# Patient Record
Sex: Male | Born: 1970 | Race: White | Hispanic: No | Marital: Married | Smoking: Current every day smoker
Health system: Southern US, Community
[De-identification: ages and names within clinical notes are randomized; demographics above are authoritative.]

## PROBLEM LIST (undated history)

## (undated) DIAGNOSIS — F319 Bipolar disorder, unspecified: Secondary | ICD-10-CM

## (undated) DIAGNOSIS — M5126 Other intervertebral disc displacement, lumbar region: Secondary | ICD-10-CM

## (undated) DIAGNOSIS — F329 Major depressive disorder, single episode, unspecified: Secondary | ICD-10-CM

## (undated) DIAGNOSIS — F419 Anxiety disorder, unspecified: Secondary | ICD-10-CM

## (undated) DIAGNOSIS — F209 Schizophrenia, unspecified: Secondary | ICD-10-CM

## (undated) DIAGNOSIS — F32A Depression, unspecified: Secondary | ICD-10-CM

## (undated) HISTORY — DX: Depression, unspecified: F32.A

## (undated) HISTORY — DX: Major depressive disorder, single episode, unspecified: F32.9

## (undated) HISTORY — PX: APPENDECTOMY: SHX54

---

## 2001-05-24 ENCOUNTER — Emergency Department (HOSPITAL_COMMUNITY): Admission: EM | Admit: 2001-05-24 | Discharge: 2001-05-24 | Payer: Self-pay | Admitting: Emergency Medicine

## 2001-10-12 ENCOUNTER — Encounter: Payer: Self-pay | Admitting: *Deleted

## 2001-10-12 ENCOUNTER — Emergency Department (HOSPITAL_COMMUNITY): Admission: EM | Admit: 2001-10-12 | Discharge: 2001-10-12 | Payer: Self-pay | Admitting: Emergency Medicine

## 2001-11-04 ENCOUNTER — Encounter: Payer: Self-pay | Admitting: *Deleted

## 2001-11-04 ENCOUNTER — Emergency Department (HOSPITAL_COMMUNITY): Admission: EM | Admit: 2001-11-04 | Discharge: 2001-11-04 | Payer: Self-pay | Admitting: *Deleted

## 2001-11-19 ENCOUNTER — Encounter: Payer: Self-pay | Admitting: Family Medicine

## 2001-11-19 ENCOUNTER — Ambulatory Visit (HOSPITAL_COMMUNITY): Admission: RE | Admit: 2001-11-19 | Discharge: 2001-11-19 | Payer: Self-pay | Admitting: Family Medicine

## 2001-12-23 ENCOUNTER — Encounter: Payer: Self-pay | Admitting: *Deleted

## 2001-12-23 ENCOUNTER — Emergency Department (HOSPITAL_COMMUNITY): Admission: EM | Admit: 2001-12-23 | Discharge: 2001-12-23 | Payer: Self-pay | Admitting: *Deleted

## 2002-01-05 ENCOUNTER — Emergency Department (HOSPITAL_COMMUNITY): Admission: EM | Admit: 2002-01-05 | Discharge: 2002-01-05 | Payer: Self-pay | Admitting: *Deleted

## 2002-02-01 ENCOUNTER — Emergency Department (HOSPITAL_COMMUNITY): Admission: EM | Admit: 2002-02-01 | Discharge: 2002-02-01 | Payer: Self-pay | Admitting: Specialist

## 2002-03-22 ENCOUNTER — Emergency Department (HOSPITAL_COMMUNITY): Admission: EM | Admit: 2002-03-22 | Discharge: 2002-03-22 | Payer: Self-pay | Admitting: Emergency Medicine

## 2002-03-23 ENCOUNTER — Emergency Department (HOSPITAL_COMMUNITY): Admission: EM | Admit: 2002-03-23 | Discharge: 2002-03-23 | Payer: Self-pay | Admitting: Emergency Medicine

## 2002-05-24 ENCOUNTER — Emergency Department (HOSPITAL_COMMUNITY): Admission: EM | Admit: 2002-05-24 | Discharge: 2002-05-25 | Payer: Self-pay | Admitting: *Deleted

## 2002-06-07 ENCOUNTER — Emergency Department (HOSPITAL_COMMUNITY): Admission: EM | Admit: 2002-06-07 | Discharge: 2002-06-07 | Payer: Self-pay | Admitting: Emergency Medicine

## 2002-07-04 ENCOUNTER — Ambulatory Visit (HOSPITAL_COMMUNITY): Admission: RE | Admit: 2002-07-04 | Discharge: 2002-07-04 | Payer: Self-pay | Admitting: General Surgery

## 2002-08-19 ENCOUNTER — Emergency Department (HOSPITAL_COMMUNITY): Admission: EM | Admit: 2002-08-19 | Discharge: 2002-08-19 | Payer: Self-pay | Admitting: Emergency Medicine

## 2002-08-19 ENCOUNTER — Encounter: Payer: Self-pay | Admitting: *Deleted

## 2002-08-20 ENCOUNTER — Emergency Department (HOSPITAL_COMMUNITY): Admission: EM | Admit: 2002-08-20 | Discharge: 2002-08-20 | Payer: Self-pay | Admitting: Emergency Medicine

## 2002-10-09 ENCOUNTER — Emergency Department (HOSPITAL_COMMUNITY): Admission: EM | Admit: 2002-10-09 | Discharge: 2002-10-09 | Payer: Self-pay | Admitting: Emergency Medicine

## 2002-10-13 ENCOUNTER — Encounter: Payer: Self-pay | Admitting: *Deleted

## 2002-10-13 ENCOUNTER — Emergency Department (HOSPITAL_COMMUNITY): Admission: EM | Admit: 2002-10-13 | Discharge: 2002-10-13 | Payer: Self-pay | Admitting: *Deleted

## 2002-10-21 ENCOUNTER — Encounter: Payer: Self-pay | Admitting: Family Medicine

## 2002-10-21 ENCOUNTER — Ambulatory Visit (HOSPITAL_COMMUNITY): Admission: RE | Admit: 2002-10-21 | Discharge: 2002-10-21 | Payer: Self-pay | Admitting: Family Medicine

## 2002-10-24 ENCOUNTER — Emergency Department (HOSPITAL_COMMUNITY): Admission: EM | Admit: 2002-10-24 | Discharge: 2002-10-24 | Payer: Self-pay | Admitting: Emergency Medicine

## 2002-12-26 ENCOUNTER — Emergency Department (HOSPITAL_COMMUNITY): Admission: EM | Admit: 2002-12-26 | Discharge: 2002-12-26 | Payer: Self-pay | Admitting: Emergency Medicine

## 2003-01-03 ENCOUNTER — Inpatient Hospital Stay (HOSPITAL_COMMUNITY): Admission: EM | Admit: 2003-01-03 | Discharge: 2003-01-10 | Payer: Self-pay | Admitting: Psychiatry

## 2003-01-09 ENCOUNTER — Emergency Department (HOSPITAL_COMMUNITY): Admission: EM | Admit: 2003-01-09 | Discharge: 2003-01-09 | Payer: Self-pay | Admitting: Emergency Medicine

## 2003-01-09 ENCOUNTER — Encounter: Payer: Self-pay | Admitting: Emergency Medicine

## 2003-05-10 ENCOUNTER — Emergency Department (HOSPITAL_COMMUNITY): Admission: EM | Admit: 2003-05-10 | Discharge: 2003-05-10 | Payer: Self-pay | Admitting: Emergency Medicine

## 2003-05-10 ENCOUNTER — Encounter: Payer: Self-pay | Admitting: Emergency Medicine

## 2003-05-20 ENCOUNTER — Emergency Department (HOSPITAL_COMMUNITY): Admission: EM | Admit: 2003-05-20 | Discharge: 2003-05-20 | Payer: Self-pay | Admitting: Emergency Medicine

## 2003-11-09 ENCOUNTER — Emergency Department (HOSPITAL_COMMUNITY): Admission: EM | Admit: 2003-11-09 | Discharge: 2003-11-09 | Payer: Self-pay | Admitting: Emergency Medicine

## 2007-03-10 ENCOUNTER — Emergency Department (HOSPITAL_COMMUNITY): Admission: EM | Admit: 2007-03-10 | Discharge: 2007-03-10 | Payer: Self-pay | Admitting: Emergency Medicine

## 2007-03-15 ENCOUNTER — Emergency Department (HOSPITAL_COMMUNITY): Admission: EM | Admit: 2007-03-15 | Discharge: 2007-03-15 | Payer: Self-pay | Admitting: Emergency Medicine

## 2007-04-19 ENCOUNTER — Emergency Department (HOSPITAL_COMMUNITY): Admission: EM | Admit: 2007-04-19 | Discharge: 2007-04-19 | Payer: Self-pay | Admitting: Emergency Medicine

## 2007-06-01 ENCOUNTER — Emergency Department (HOSPITAL_COMMUNITY): Admission: EM | Admit: 2007-06-01 | Discharge: 2007-06-01 | Payer: Self-pay | Admitting: Emergency Medicine

## 2007-07-08 ENCOUNTER — Emergency Department (HOSPITAL_COMMUNITY): Admission: EM | Admit: 2007-07-08 | Discharge: 2007-07-08 | Payer: Self-pay | Admitting: Psychiatry

## 2007-07-22 ENCOUNTER — Emergency Department (HOSPITAL_COMMUNITY): Admission: EM | Admit: 2007-07-22 | Discharge: 2007-07-22 | Payer: Self-pay | Admitting: Emergency Medicine

## 2007-09-02 ENCOUNTER — Emergency Department (HOSPITAL_COMMUNITY): Admission: EM | Admit: 2007-09-02 | Discharge: 2007-09-03 | Payer: Self-pay | Admitting: Emergency Medicine

## 2007-09-02 ENCOUNTER — Ambulatory Visit: Payer: Self-pay | Admitting: Gastroenterology
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
## 2011-07-18 LAB — URINALYSIS, ROUTINE W REFLEX MICROSCOPIC
Glucose, UA: NEGATIVE
Ketones, ur: NEGATIVE
Leukocytes, UA: NEGATIVE
Nitrite: NEGATIVE
Protein, ur: NEGATIVE

## 2011-07-18 LAB — CBC
HCT: 39.3
Hemoglobin: 13.4
MCHC: 34
RDW: 13.8

## 2011-07-18 LAB — DIFFERENTIAL
Basophils Absolute: 0
Basophils Relative: 0
Eosinophils Absolute: 0.1
Eosinophils Relative: 1
Monocytes Absolute: 0.5
Neutro Abs: 5.9

## 2011-07-18 LAB — BASIC METABOLIC PANEL
CO2: 27
Calcium: 8.6
Glucose, Bld: 107 — ABNORMAL HIGH
Sodium: 138

## 2014-08-17 ENCOUNTER — Emergency Department (HOSPITAL_COMMUNITY)
Admission: EM | Admit: 2014-08-17 | Discharge: 2014-08-17 | Disposition: A | Discharge status: Home / Self Care | Payer: Self-pay | Attending: Emergency Medicine | Admitting: Emergency Medicine

## 2014-08-17 ENCOUNTER — Encounter (HOSPITAL_COMMUNITY): Payer: Self-pay | Admitting: *Deleted

## 2014-08-17 DIAGNOSIS — Z720 Tobacco use: Secondary | ICD-10-CM | POA: Insufficient documentation

## 2014-08-17 DIAGNOSIS — M5441 Lumbago with sciatica, right side: Secondary | ICD-10-CM | POA: Insufficient documentation

## 2014-08-17 HISTORY — DX: Other intervertebral disc displacement, lumbar region: M51.26

## 2014-08-17 MED ORDER — CYCLOBENZAPRINE HCL 10 MG PO TABS: 10.0000 mg | ORAL_TABLET | Freq: Two times a day (BID) | ORAL | Status: DC | PRN

## 2014-08-17 MED ORDER — HYDROCODONE-ACETAMINOPHEN 5-325 MG PO TABS: 1.0000 | ORAL_TABLET | Freq: Four times a day (QID) | ORAL | Status: DC | PRN
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
## 2014-08-17 NOTE — ED Provider Notes (Signed)
CSN: [id number]     Arrival date & time 08/17/14  1329 History   First MD Initiated Contact with Patient 08/17/14 1349     Chief Complaint  Patient presents with  . Back Pain     (Consider location/radiation/quality/duration/timing/severity/associated sxs/prior Treatment) HPI Comments: Pt states that he has been having right sided back pain with radiation down his right leg times 2 days. Denies numbness, weakness or dysuria. Pt states that he has a history of herniated disk and he was supposed to be seen with neurosurgery but he never follow up. Pt denies injury he states that the fence injury was not related.he has tried otc medications without relief. Denies fever or history of drug use  The history is provided by the patient. No language interpreter was used.    Past Medical History  Diagnosis Date  . Lumbar herniated disc    Past Surgical History  Procedure Laterality Date  . Appendectomy     History reviewed. No pertinent family history. History  Substance Use Topics  . Smoking status: Current Every Day Smoker  . Smokeless tobacco: Not on file  . Alcohol Use: No    Review of Systems  All other systems reviewed and are negative.     Allergies  Review of patient's allergies indicates no known allergies.  Home Medications   Prior to Admission medications   Medication Sig Start Date End Date Taking? Authorizing Provider  cyclobenzaprine (FLEXERIL) 10 MG tablet Take 1 tablet (10 mg total) by mouth 2 (two) times daily as needed for muscle spasms. 08/17/14   Teressa LowerVrinda Paulette Rockford, NP  HYDROcodone-acetaminophen (NORCO/VICODIN) 5-325 MG per tablet Take 1-2 tablets by mouth every 6 (six) hours as needed. 08/17/14   Teressa LowerVrinda Brandalynn Ofallon, NP   BP 121/81 mmHg  Pulse 86  Temp(Src) 98.2 F (36.8 C) (Oral)  Resp 18  Ht 6' (1.829 m)  Wt 212 lb (96.163 kg)  BMI 28.75 kg/m2  SpO2 99% Physical Exam  Constitutional: He is oriented to person, place, and time. He appears well-developed  and well-nourished.  Cardiovascular: Normal rate and regular rhythm.   Pulmonary/Chest: Effort normal and breath sounds normal.  Musculoskeletal: Normal range of motion.  Right lower lumbar and sciatic notch tenderness. Equal strength in bilateral lower extremities  Neurological: He is alert and oriented to person, place, and time. He exhibits normal muscle tone. Coordination normal.  Skin: Skin is warm and dry.  Psychiatric: He has a normal mood and affect.  Nursing note and vitals reviewed.   ED Course  Procedures (including critical care time) Labs Review Labs Reviewed - No data to display  Imaging Review No results found.   EKG Interpretation None      MDM   Final diagnoses:  Right-sided low back pain with right-sided sciatica    Neurologically intact. Will treat symptomatically. Discussed follow up with pt    Teressa LowerVrinda Syanne Looney, NP 08/17/14 1404  Glynn OctaveStephen Rancour, MD 08/17/14 1511

## 2014-08-17 NOTE — Discharge Instructions (Signed)
Back Pain, Adult °Back pain is very common. The pain often gets better over time. The cause of back pain is usually not dangerous. Most people can learn to manage their back pain on their own.  °HOME CARE  °· Stay active. Start with short walks on flat ground if you can. Try to walk farther each day. °· Do not sit, drive, or stand in one place for more than 30 minutes. Do not stay in bed. °· Do not avoid exercise or work. Activity can help your back heal faster. °· Be careful when you bend or lift an object. Bend at your knees, keep the object close to you, and do not twist. °· Sleep on a firm mattress. Lie on your side, and bend your knees. If you lie on your back, put a pillow under your knees. °· Only take medicines as told by your doctor. °· Put ice on the injured area. °¨ Put ice in a plastic bag. °¨ Place a towel between your skin and the bag. °¨ Leave the ice on for 15-20 minutes, 03-04 times a day for the first 2 to 3 days. After that, you can switch between ice and heat packs. °· Ask your doctor about back exercises or massage. °· Avoid feeling anxious or stressed. Find good ways to deal with stress, such as exercise. °GET HELP RIGHT AWAY IF:  °· Your pain does not go away with rest or medicine. °· Your pain does not go away in 1 week. °· You have new problems. °· You do not feel well. °· The pain spreads into your legs. °· You cannot control when you poop (bowel movement) or pee (urinate). °· Your arms or legs feel weak or lose feeling (numbness). °· You feel sick to your stomach (nauseous) or throw up (vomit). °· You have belly (abdominal) pain. °· You feel like you may pass out (faint). °MAKE SURE YOU:  °· Understand these instructions. °· Will watch your condition. °· Will get help right away if you are not doing well or get worse. °Document Released: 03/10/2008 Document Revised: 12/15/2011 Document Reviewed: 01/24/2014 °ExitCare® Patient Information ©2015 [employer]. This information is not intended  to replace advice given to you by your health care provider. Make sure you discuss any questions you have with your health care provider. ° °

## 2014-12-12 ENCOUNTER — Encounter (HOSPITAL_COMMUNITY): Payer: Self-pay | Admitting: *Deleted

## 2014-12-12 ENCOUNTER — Emergency Department (HOSPITAL_COMMUNITY)
Admission: EM | Admit: 2014-12-12 | Discharge: 2014-12-12 | Disposition: A | Discharge status: Home / Self Care | Payer: Self-pay | Attending: Emergency Medicine | Admitting: Emergency Medicine

## 2014-12-12 DIAGNOSIS — M545 Low back pain: Secondary | ICD-10-CM | POA: Insufficient documentation

## 2014-12-12 DIAGNOSIS — Z720 Tobacco use: Secondary | ICD-10-CM | POA: Insufficient documentation

## 2014-12-12 DIAGNOSIS — Z79899 Other long term (current) drug therapy: Secondary | ICD-10-CM | POA: Insufficient documentation

## 2014-12-12 DIAGNOSIS — G8929 Other chronic pain: Secondary | ICD-10-CM | POA: Insufficient documentation

## 2014-12-12 MED ORDER — DEXAMETHASONE SODIUM PHOSPHATE 4 MG/ML IJ SOLN: 10.0000 mg | Freq: Once | INTRAMUSCULAR | Status: DC

## 2014-12-12 MED ORDER — DEXAMETHASONE SODIUM PHOSPHATE 4 MG/ML IJ SOLN
10.0000 mg | Freq: Once | INTRAMUSCULAR | Status: AC
  Administered 2014-12-12: 10 mg via INTRAMUSCULAR
  Filled 2014-12-12: qty 3

## 2014-12-12 MED ORDER — PREDNISONE 20 MG PO TABS: ORAL_TABLET | ORAL | Status: DC

## 2014-12-12 MED ORDER — CYCLOBENZAPRINE HCL 5 MG PO TABS: 5.0000 mg | ORAL_TABLET | Freq: Three times a day (TID) | ORAL | Status: DC | PRN

## 2014-12-12 NOTE — ED Provider Notes (Signed)
CSN: [id number]     Arrival date & time 12/12/14  [id number] History   First MD Initiated Contact with Patient 12/12/14 [phone]     Chief Complaint  Patient presents with  . Back Pain     (Consider location/radiation/quality/duration/timing/severity/associated sxs/prior Treatment) HPI  Patient reports he had 2 herniated disc in his back however when I review his old x-ray studies that was seen on the MRI in 2012. He states he has been incarcerated for 8 years and was released in October. While he was  Incarcerated he was treated with Neurontin. 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 He states 11 AM this morning after he got off work he had worsening of his usual low back pain. He states it radiates into his right leg which it is done for many years. He states bending and lifting makes it pain worse. He also states he wakes up at night and his leg feels numb but it is painful. This is also old.  PCP none  Past Medical History  Diagnosis Date  . Lumbar herniated disc    Past Surgical History  Procedure Laterality Date  . Appendectomy     History reviewed. No pertinent family history. History  Substance Use Topics  . Smoking status: Current Every Day Smoker -- 0.50 packs/day  . Smokeless tobacco: Not on file  . Alcohol Use: No   employed  Review of Systems  All other systems reviewed and are negative.     Allergies  Ibuprofen  Home Medications   Prior to Admission medications   Medication Sig Start Date End Date Taking? Authorizing Provider  cyclobenzaprine (FLEXERIL) 5 MG tablet Take 1 tablet (5 mg total) by mouth 3 (three) times daily as needed (muscle soreness). 12/12/14   Devoria AlbeIva Rhone Ozaki, MD  HYDROcodone-acetaminophen (NORCO/VICODIN) 5-325 MG per tablet Take 1-2 tablets by mouth every 6 (six) hours as needed. 08/17/14   Teressa LowerVrinda Pickering, NP  predniSONE (DELTASONE) 20 MG tablet Take 3 po QD x 3d , then 2 po QD x 3d then 1 po QD x 3d  12/12/14   Devoria AlbeIva Llewyn Heap, MD   BP 114/65 mmHg  Pulse 94  Temp(Src) 98 F (36.7 C) (Oral)  Resp 18  Ht 6' (1.829 m)  Wt 204 lb (92.534 kg)  BMI 27.66 kg/m2  SpO2 100%  Vital signs normal   Physical Exam  Constitutional: He is oriented to person, place, and time. He appears well-developed and well-nourished.  Non-toxic appearance. He does not appear ill. No distress.  HENT:  Head: Normocephalic and atraumatic.  Right Ear: External ear normal.  Left Ear: External ear normal.  Nose: Nose normal. No mucosal edema or rhinorrhea.  Mouth/Throat: Mucous membranes are normal. No dental abscesses or uvula swelling.  Eyes: Conjunctivae and EOM are normal. Pupils are equal, round, and reactive to light.  Neck: Normal range of motion and full passive range of motion without pain. Neck supple.  Cardiovascular: Normal rate, regular rhythm and normal heart sounds.  Exam reveals no gallop and no friction rub.   No murmur heard. Pulmonary/Chest: Effort normal. No respiratory distress. He has no rhonchi. He exhibits no crepitus.  Abdominal: Normal appearance and bowel sounds are normal.  Musculoskeletal: Normal range of motion. He exhibits tenderness. He exhibits no edema.  Patient has diffuse tenderness of his lumbar spine and his right paraspinous muscles. Patellar reflexes are 1+ bilaterally. He has positive straight leg raising with both  legs in his right lower back. Patient has pain in his right lower back with all range of motion of his waist.  Neurological: He is alert and oriented to person, place, and time. He has normal strength. No cranial nerve deficit.  Skin: Skin is warm, dry and intact. No rash noted. No erythema. No pallor.  Psychiatric: He has a normal mood and affect. His speech is normal and behavior is normal. His mood appears not anxious.  Nursing note and vitals reviewed.   ED Course  Procedures (including critical care time)  Medications  dexamethasone (DECADRON) injection 10 mg  (10 mg Intramuscular Given 12/12/14 0354)   Patient drove himself to the ED.  Patient states he's only been to the ER once because of his back pain. However review of his x-ray study shows he had lumbar spine x-rays done twice in November at [facility]. Both of them were read as showing mild degenerative changes in the upper lumbar spine without acute bony abnormality. His only MRI was noted to be in 2004.  Review of the [place] shows he has gotten hydrocodone 5/325 three  times in November. Once from our emergency department,once from [facility], and once from [facility].  Labs Review Labs Reviewed - No data to display  Imaging Review No results found.   EKG Interpretation None      MDM   Final diagnoses:  Chronic lower back pain   Discharge Medication List as of 12/12/2014  3:49 AM    START taking these medications   Details  predniSONE (DELTASONE) 20 MG tablet Take 3 po QD x 3d , then 2 po QD x 3d then 1 po QD x 3d, Print        Plan discharge  Devoria Albe, MD, Concha Pyo, MD 12/12/14 [phone]

## 2014-12-12 NOTE — ED Notes (Signed)
Pt c/o lower back pain that started late yesterday evening; pt states he has some bulging discs; pt states the pain radiates down his right leg; pt denies any injury

## 2014-12-12 NOTE — ED Notes (Signed)
Pt c/o chronic lower back pain radiating down his right leg. Pt ambulated to room from lobby without difficulty

## 2014-12-12 NOTE — Discharge Instructions (Signed)
Use ice and heat for comfort. Take the medications as prescribed. You need to get a doctor to manage your chronic pain.

## 2015-01-02 ENCOUNTER — Emergency Department (HOSPITAL_COMMUNITY)
Admission: EM | Admit: 2015-01-02 | Discharge: 2015-01-02 | Disposition: A | Discharge status: Home / Self Care | Payer: Self-pay | Attending: Emergency Medicine | Admitting: Emergency Medicine

## 2015-01-02 ENCOUNTER — Encounter (HOSPITAL_COMMUNITY): Payer: Self-pay | Admitting: *Deleted

## 2015-01-02 DIAGNOSIS — M5136 Other intervertebral disc degeneration, lumbar region: Secondary | ICD-10-CM | POA: Insufficient documentation

## 2015-01-02 DIAGNOSIS — Z7952 Long term (current) use of systemic steroids: Secondary | ICD-10-CM | POA: Insufficient documentation

## 2015-01-02 DIAGNOSIS — M5431 Sciatica, right side: Secondary | ICD-10-CM | POA: Insufficient documentation

## 2015-01-02 DIAGNOSIS — Z720 Tobacco use: Secondary | ICD-10-CM | POA: Insufficient documentation

## 2015-01-02 MED ORDER — DEXAMETHASONE 4 MG PO TABS: 4.0000 mg | ORAL_TABLET | Freq: Two times a day (BID) | ORAL | Status: DC

## 2015-01-02 MED ORDER — CYCLOBENZAPRINE HCL 10 MG PO TABS: 10.0000 mg | ORAL_TABLET | Freq: Three times a day (TID) | ORAL | Status: DC

## 2015-01-02 MED ORDER — DIAZEPAM 5 MG PO TABS
10.0000 mg | ORAL_TABLET | Freq: Once | ORAL | Status: AC
  Administered 2015-01-02: 10 mg via ORAL
  Filled 2015-01-02: qty 2

## 2015-01-02 MED ORDER — DEXAMETHASONE SODIUM PHOSPHATE 4 MG/ML IJ SOLN
8.0000 mg | Freq: Once | INTRAMUSCULAR | Status: AC
  Administered 2015-01-02: 8 mg via INTRAMUSCULAR
  Filled 2015-01-02: qty 2

## 2015-01-02 NOTE — ED Notes (Signed)
Pt states he has chronic back pain for years caused from bulding discs. Pt has had worsening pain starting this morning. Pt denies any new injury, stating he has only been working. Pain moves down both legs but primarily his left. Pt ambulated to bed. NAD noted.

## 2015-01-02 NOTE — ED Provider Notes (Signed)
CSN: [id number]     Arrival date & time 01/02/15  7829 History   First MD Initiated Contact with Patient 01/02/15 [phone]     Chief Complaint  Patient presents with  . Back Pain     (Consider location/radiation/quality/duration/timing/severity/associated sxs/prior Treatment) Patient is a 44 y.o. male presenting with back pain. The history is provided by the patient.  Back Pain Location:  Lumbar spine Quality:  Burning and shooting Pain severity:  Moderate Pain is:  Same all the time Onset quality:  Gradual Timing:  Intermittent Progression:  Worsening Chronicity:  Chronic Context comment:  Hx of DDD by MRI 8 years ago. Pain getting worse. Relieved by:  Nothing Worsened by:  Standing and sitting Ineffective treatments:  None tried Associated symptoms: numbness   Associated symptoms: no bladder incontinence, no bowel incontinence, no headaches, no perianal numbness and no weakness   Risk factors: no recent surgery     Past Medical History  Diagnosis Date  . Lumbar herniated disc    Past Surgical History  Procedure Laterality Date  . Appendectomy     No family history on file. History  Substance Use Topics  . Smoking status: Current Every Day Smoker -- 0.50 packs/day  . Smokeless tobacco: Not on file  . Alcohol Use: No    Review of Systems  Gastrointestinal: Negative for bowel incontinence.  Genitourinary: Negative for bladder incontinence.  Musculoskeletal: Positive for back pain.  Neurological: Positive for numbness. Negative for dizziness, syncope, weakness, light-headedness and headaches.  All other systems reviewed and are negative.     Allergies  Ibuprofen  Home Medications   Prior to Admission medications   Medication Sig Start Date End Date Taking? Authorizing Provider  Diphenhydramine-APAP, sleep, (GOODY PM PO) Take 1 Package by mouth daily as needed (pain).   Yes Historical Provider, MD  cyclobenzaprine (FLEXERIL) 5 MG tablet Take 1 tablet (5 mg  total) by mouth 3 (three) times daily as needed (muscle soreness). Patient not taking: Reported on 01/02/2015 12/12/14   Devoria Albe, MD  HYDROcodone-acetaminophen (NORCO/VICODIN) 5-325 MG per tablet Take 1-2 tablets by mouth every 6 (six) hours as needed. Patient not taking: Reported on 01/02/2015 08/17/14   Teressa Lower, NP  predniSONE (DELTASONE) 20 MG tablet Take 3 po QD x 3d , then 2 po QD x 3d then 1 po QD x 3d Patient not taking: Reported on 01/02/2015 12/12/14   Devoria Albe, MD   BP 127/64 mmHg  Pulse 103  Temp(Src) 97.7 F (36.5 C) (Oral)  Resp 18  Ht 6' (1.829 m)  Wt 204 lb (92.534 kg)  BMI 27.66 kg/m2  SpO2 100% Physical Exam  Constitutional: He is oriented to person, place, and time. He appears well-developed and well-nourished.  Non-toxic appearance.  HENT:  Head: Normocephalic.  Right Ear: Tympanic membrane and external ear normal.  Left Ear: Tympanic membrane and external ear normal.  Eyes: EOM and lids are normal. Pupils are equal, round, and reactive to light.  Neck: Normal range of motion. Neck supple. Carotid bruit is not present.  Cardiovascular: Normal rate, regular rhythm, normal heart sounds, intact distal pulses and normal pulses.   Pulmonary/Chest: Breath sounds normal. No respiratory distress.  Abdominal: Soft. Bowel sounds are normal. There is no tenderness. There is no guarding.  Musculoskeletal:       Lumbar back: He exhibits decreased range of motion, tenderness and pain.  Right SLR tenderness.  Lymphadenopathy:       Head (right side): No submandibular adenopathy present.  Head (left side): No submandibular adenopathy present.    He has no cervical adenopathy.  Neurological: He is alert and oriented to person, place, and time. He has normal strength. No cranial nerve deficit or sensory deficit. He exhibits normal muscle tone. Coordination normal.  No motor or sensory deficit noted.  Skin: Skin is warm and dry.  Psychiatric: He has a normal mood and  affect. His speech is normal.  Nursing note and vitals reviewed.   ED Course  Procedures (including critical care time) Labs Review Labs Reviewed - No data to display  Imaging Review No results found.   EKG Interpretation None      No loss of bowel or bladder function. No caudal equina suggested.  Discessed exam findings with pt. Discussed the need for pt to follow up with PCP. Rx for decadron and flexeril given to the patient.   Final diagnoses:  None    *I have reviewed nursing notes, vital signs, and all appropriate lab and imaging results for this patient.Ivery Quale**    Stormi Vandevelde, PA-C 01/03/15 1456  Glynn OctaveStephen Rancour, MD 01/03/15 1600

## 2015-01-02 NOTE — Discharge Instructions (Signed)
Review of your scans and a trace reveals evidence of some degenerative disc disease involving her lower back. Your examination today is consistent with sciatica. It is important that you see Dr. Carlena SaxFosco, or member of his team for recheck, possible MRI, and appropriate referral. Please use a heating pad to your back. Please rest your back is much as possible. Please use medications prescribed today as suggested. Flexeril may cause drowsiness, please use with caution. Please use Decadron daily with food. Sciatica Sciatica is pain, weakness, numbness, or tingling along your sciatic nerve. The nerve starts in the lower back and runs down the back of each leg. Nerve damage or certain conditions pinch or put pressure on the sciatic nerve. This causes the pain, weakness, and other discomforts of sciatica. HOME CARE   Only take medicine as told by your doctor.  Apply ice to the affected area for 20 minutes. Do this 3-4 times a day for the first 48-72 hours. Then try heat in the same way.  Exercise, stretch, or do your usual activities if these do not make your pain worse.  Go to physical therapy as told by your doctor.  Keep all doctor visits as told.  Do not wear high heels or shoes that are not supportive.  Get a firm mattress if your mattress is too soft to lessen pain and discomfort. GET HELP RIGHT AWAY IF:   You cannot control when you poop (bowel movement) or pee (urinate).  You have more weakness in your lower back, lower belly (pelvis), butt (buttocks), or legs.  You have redness or puffiness (swelling) of your back.  You have a burning feeling when you pee.  You have pain that gets worse when you lie down.  You have pain that wakes you from your sleep.  Your pain is worse than past pain.  Your pain lasts longer than 4 weeks.  You are suddenly losing weight without reason. MAKE SURE YOU:   Understand these instructions.  Will watch this condition.  Will get help right away if  you are not doing well or get worse. Document Released: 07/01/2008 Document Revised: 03/23/2012 Document Reviewed: 02/01/2012 [facility] Patient Information 2015 [employer], [place]. This information is not intended to replace advice given to you by your health care provider. Make sure you discuss any questions you have with your health care provider.

## 2015-01-18 ENCOUNTER — Other Ambulatory Visit (HOSPITAL_COMMUNITY): Payer: Self-pay | Admitting: Physician Assistant

## 2015-01-18 DIAGNOSIS — R52 Pain, unspecified: Secondary | ICD-10-CM

## 2015-01-24 ENCOUNTER — Ambulatory Visit (HOSPITAL_COMMUNITY)
Admission: RE | Admit: 2015-01-24 | Discharge: 2015-01-24 | Disposition: A | Discharge status: Home / Self Care | Payer: Self-pay | Source: Ambulatory Visit | Attending: Physician Assistant | Admitting: Physician Assistant

## 2015-01-24 DIAGNOSIS — M545 Low back pain: Secondary | ICD-10-CM | POA: Insufficient documentation

## 2015-01-24 DIAGNOSIS — R52 Pain, unspecified: Secondary | ICD-10-CM

## 2015-02-25 ENCOUNTER — Encounter (HOSPITAL_COMMUNITY): Payer: Self-pay | Admitting: Emergency Medicine

## 2015-02-25 ENCOUNTER — Emergency Department (HOSPITAL_COMMUNITY)
Admission: EM | Admit: 2015-02-25 | Discharge: 2015-02-25 | Disposition: A | Discharge status: Home / Self Care | Payer: Self-pay | Attending: Emergency Medicine | Admitting: Emergency Medicine

## 2015-02-25 DIAGNOSIS — Z720 Tobacco use: Secondary | ICD-10-CM | POA: Insufficient documentation

## 2015-02-25 DIAGNOSIS — M5431 Sciatica, right side: Secondary | ICD-10-CM | POA: Insufficient documentation

## 2015-02-25 DIAGNOSIS — Z79899 Other long term (current) drug therapy: Secondary | ICD-10-CM | POA: Insufficient documentation

## 2015-02-25 MED ORDER — HYDROCODONE-ACETAMINOPHEN 5-325 MG PO TABS: 1.0000 | ORAL_TABLET | ORAL | Status: DC | PRN

## 2015-02-25 MED ORDER — OXYCODONE-ACETAMINOPHEN 5-325 MG PO TABS
1.0000 | ORAL_TABLET | Freq: Once | ORAL | Status: AC
  Administered 2015-02-25: 1 via ORAL
  Filled 2015-02-25: qty 1

## 2015-02-25 MED ORDER — CYCLOBENZAPRINE HCL 10 MG PO TABS: 10.0000 mg | ORAL_TABLET | Freq: Two times a day (BID) | ORAL | Status: DC | PRN

## 2015-02-25 MED ORDER — CYCLOBENZAPRINE HCL 10 MG PO TABS
10.0000 mg | ORAL_TABLET | Freq: Once | ORAL | Status: AC
  Administered 2015-02-25: 10 mg via ORAL
  Filled 2015-02-25: qty 1

## 2015-02-25 NOTE — Discharge Instructions (Signed)
[facility] to and get back on your Neurontin. Do not take the medications we give you if driving because they will make you sleepy.

## 2015-02-25 NOTE — ED Provider Notes (Signed)
CSN: [id number]     Arrival date & time 02/25/15  1105 History  This chart was scribed for non-physician practitioner Kerrie Buffalo, NP, working with Vanetta Mulders, MD, by Tanda Rockers, ED Scribe. This patient was seen in room APFT22/APFT22 and the patient's care was started at 12:31 PM.    Chief Complaint  Patient presents with  . Back Pain   The history is provided by the patient. No language interpreter was used.     HPI Comments: Brian West is a 44 y.o. male with hx herniated lumbar disc and DDD who presents to the Emergency Department complaining of right lower back pain x 1 day. Pt states that the pain began after mowing his lawn yesterday. He describes the pain as a sharp, stabbing sensation. He rates the pain as an 8/10 on the pain scale. The pain radiates to his right buttock and right leg. Pt has had intermittent back pain since 2003. The pain is exacerbated anytime he does any physical activity. Pt is in the process of applying for disability. Pt has been taking 200 mg Ne[facility]in BID [facility] but has not had it filled in the last 2 weeks due to being unable to pay for it. Denies urinary or bowel incontinence, numbness or tingling in lower extremities, or any other symptoms.   Past Medical History  Diagnosis Date  . Lumbar herniated disc    Past Surgical History  Procedure Laterality Date  . Appendectomy     Family History  Problem Relation Age of Onset  . Cancer Mother   . Diabetes Mother    History  Substance Use Topics  . Smoking status: Current Every Day Smoker -- 1.00 packs/day    Types: Cigarettes  . Smokeless tobacco: Former Neurosurgeon  . Alcohol Use: No    Review of Systems  Constitutional: Negative for fever.  Respiratory: Negative for shortness of breath.   Cardiovascular: Negative for chest pain and leg swelling.  Gastrointestinal: Negative for abdominal pain, constipation and abdominal distention.  Genitourinary: Negative for  dysuria, urgency, frequency, flank pain and difficulty urinating.       Negative for incontinence.   Musculoskeletal: Positive for back pain (Right lower back pain. ) and arthralgias (Right buttock pain. Right lower extremity pain. ). Negative for joint swelling and gait problem.  Skin: Negative for rash.  Neurological: Negative for weakness and numbness.      Allergies  Ibuprofen  Home Medications   Prior to Admission medications   Medication Sig Start Date End Date Taking? Authorizing Provider  cyclobenzaprine (FLEXERIL) 10 MG tablet Take 1 tablet (10 mg total) by mouth 2 (two) times daily as needed for muscle spasms. 02/25/15   Marianne Golightly Orlene Och, NP  Diphenhydramine-APAP, sleep, (GOODY PM PO) Take 1 Package by mouth daily as needed (pain).    Historical Provider, MD  HYDROcodone-acetaminophen (NORCO/VICODIN) 5-325 MG per tablet Take 1 tablet by mouth every 4 (four) hours as needed. 02/25/15   Jaylon Grode Orlene Och, NP  predniSONE (DELTASONE) 20 MG tablet Take 3 po QD x 3d , then 2 po QD x 3d then 1 po QD x 3d Patient not taking: Reported on 01/02/2015 12/12/14   Devoria Albe, MD   Triage Vitals: BP 108/77 mmHg  Pulse 99  Temp(Src) 97.9 F (36.6 C) (Oral)  Resp 20  Ht 6' (1.829 m)  Wt 188 lb (85.276 kg)  BMI 25.49 kg/m2  SpO2 99%   Physical Exam  Constitutional: He is oriented  to person, place, and time. He appears well-developed and well-nourished. No distress.  Patient appears uncomfortable.   HENT:  Head: Normocephalic and atraumatic.  Eyes: Conjunctivae and EOM are normal. Pupils are equal, round, and reactive to light.  Neck: Normal range of motion. Neck supple. No tracheal deviation present.  Cardiovascular: Normal rate, regular rhythm and normal heart sounds.   Pulmonary/Chest: Effort normal and breath sounds normal. No respiratory distress. He has no wheezes. He has no rales. He exhibits no tenderness.  Abdominal: Soft. Bowel sounds are normal. There is no tenderness.  Musculoskeletal:  Normal range of motion. He exhibits no edema.       Lumbar back: He exhibits tenderness. He exhibits normal range of motion, no deformity, no spasm and normal pulse.  Pain with straight leg raise on right.  No C spine tenderness.  No CVA tenderness.  Muscle spasm right lumbar area that radiates to the right sciatic nerve. Ambulatory with steady gait.  No foot drag.     Neurological: He is alert and oriented to person, place, and time. He has normal strength. No cranial nerve deficit or sensory deficit. Coordination and gait normal.  Reflex Scores:      Bicep reflexes are 2+ on the right side and 2+ on the left side.      Brachioradialis reflexes are 2+ on the right side and 2+ on the left side.      Patellar reflexes are 2+ on the right side and 2+ on the left side.      Achilles reflexes are 2+ on the right side and 2+ on the left side. Grips are equal bilaterally.   Skin: Skin is warm and dry.  Psychiatric: He has a normal mood and affect. His behavior is normal.  Nursing note and vitals reviewed.   ED Course  Procedures (including critical care time)  DIAGNOSTIC STUDIES: Oxygen Saturation is 99% on RA, normal by my interpretation.    COORDINATION OF CARE: 12:40 PM-Discussed treatment plan which includes rx muscle relaxer with pt at bedside and pt agreed to plan.    MDM  44 y.o. male with hx of back pain with acute exacerbation after mowing the lawn. Stable for d/c without focal neuro deficits. Will treat for pain and inflammation. [facility] and try to arrange to have his Neurontin Rx filled.    Final diagnoses:  Sciatica, right    I personally performed the services described in this documentation, which was scribed in my presence. The recorded information has been reviewed and is accurate.      FairviewHope M Sophy Mesler, [place]NP 02/25/15 1813  Vanetta MuldersScott Zackowski, MD 02/27/15 [phone]

## 2015-02-25 NOTE — ED Notes (Signed)
Pt c/o low back pain after mowing the yard yesterday.

## 2015-07-17 ENCOUNTER — Ambulatory Visit: Payer: Self-pay | Admitting: Physician Assistant

## 2016-01-31 ENCOUNTER — Emergency Department (HOSPITAL_COMMUNITY)
Admission: EM | Admit: 2016-01-31 | Discharge: 2016-01-31 | Disposition: A | Discharge status: Home / Self Care | Payer: Self-pay | Attending: Emergency Medicine | Admitting: Emergency Medicine

## 2016-01-31 ENCOUNTER — Emergency Department (HOSPITAL_COMMUNITY): Payer: Self-pay

## 2016-01-31 ENCOUNTER — Encounter (HOSPITAL_COMMUNITY): Payer: Self-pay | Admitting: Emergency Medicine

## 2016-01-31 DIAGNOSIS — G629 Polyneuropathy, unspecified: Secondary | ICD-10-CM | POA: Insufficient documentation

## 2016-01-31 DIAGNOSIS — M5136 Other intervertebral disc degeneration, lumbar region: Secondary | ICD-10-CM | POA: Insufficient documentation

## 2016-01-31 DIAGNOSIS — Z79899 Other long term (current) drug therapy: Secondary | ICD-10-CM | POA: Insufficient documentation

## 2016-01-31 DIAGNOSIS — F17210 Nicotine dependence, cigarettes, uncomplicated: Secondary | ICD-10-CM | POA: Insufficient documentation

## 2016-01-31 MED ORDER — HYDROMORPHONE HCL 1 MG/ML IJ SOLN
1.0000 mg | Freq: Once | INTRAMUSCULAR | Status: AC
  Administered 2016-01-31: 1 mg via INTRAMUSCULAR
  Filled 2016-01-31: qty 1

## 2016-01-31 MED ORDER — HYDROCODONE-ACETAMINOPHEN 5-325 MG PO TABS: 1.0000 | ORAL_TABLET | ORAL | Status: DC | PRN

## 2016-01-31 MED ORDER — DEXAMETHASONE SODIUM PHOSPHATE 4 MG/ML IJ SOLN
8.0000 mg | Freq: Once | INTRAMUSCULAR | Status: AC
  Administered 2016-01-31: 8 mg via INTRAMUSCULAR
  Filled 2016-01-31: qty 2

## 2016-01-31 MED ORDER — PREDNISONE 10 MG PO TABS
ORAL_TABLET | ORAL | Status: DC
Start: 2016-01-31 — End: 2016-05-12

## 2016-01-31 NOTE — Discharge Instructions (Signed)
Degenerative Disk Disease  Degenerative disk disease is a condition caused by the changes that occur in spinal disks as you grow older. Spinal disks are soft and compressible disks located between the bones of your spine (vertebrae). These disks act like shock absorbers. Degenerative disk disease can affect the whole spine. However, the neck and lower back are most commonly affected. Many changes can occur in the spinal disks with aging, such as:  · The spinal disks may dry and shrink.  · Small tears may occur in the tough, outer covering of the disk (annulus).  · The disk space may become smaller due to loss of water.  · Abnormal growths in the bone (spurs) may occur. This can put pressure on the nerve roots exiting the spinal canal, causing pain.  · The spinal canal may become narrowed.  RISK FACTORS   · Being overweight.  · Having a family history of degenerative disk disease.  · Smoking.  · There is increased risk if you are doing heavy lifting or have a sudden injury.  SIGNS AND SYMPTOMS   Symptoms vary from person to person and may include:  · Pain that varies in intensity. Some people have no pain, while others have severe pain. The location of the pain depends on the part of your backbone that is affected.  ¨ You will have neck or arm pain if a disk in the neck area is affected.  ¨ You will have pain in your back, buttocks, or legs if a disk in the lower back is affected.  · Pain that becomes worse while bending, reaching up, or with twisting movements.  · Pain that may start gradually and then get worse as time passes. It may also start after a major or minor injury.  · Numbness or tingling in the arms or legs.  DIAGNOSIS   Your health care provider will ask you about your symptoms and about activities or habits that may cause the pain. He or she may also ask about any injuries, diseases, or treatments you have had. Your health care provider will examine you to check for the range of movement that is  possible in the affected area, to check for strength in your extremities, and to check for sensation in the areas of the arms and legs supplied by different nerve roots. You may also have:   · An X-ray of the spine.  · Other imaging tests, such as MRI.  TREATMENT   Your health care provider will advise you on the best plan for treatment. Treatment may include:  · Medicines.  · Rehabilitation exercises.  HOME CARE INSTRUCTIONS   · Follow proper lifting and walking techniques as advised by your health care provider.  · Maintain good posture.  · Exercise regularly as advised by your health care provider.  · Perform relaxation exercises.  · Change your sitting, standing, and sleeping habits as advised by your health care provider.  · Change positions frequently.  · Lose weight or maintain a healthy weight as advised by your health care provider.  · Do not use any tobacco products, including cigarettes, chewing tobacco, or electronic cigarettes. If you need help quitting, ask your health care provider.  · Wear supportive footwear.  · Take medicines only as directed by your health care provider.  SEEK MEDICAL CARE IF:   · Your pain does not go away within 1-4 weeks.  · You have significant appetite or weight loss.  SEEK IMMEDIATE MEDICAL CARE IF:   ·   instructions.  Will watch your condition.  Will get help right away if you are not doing well or get worse.   This information is not intended to replace advice given to you by your health care provider. Make sure you discuss any questions you have with your health care provider.   Document Released: 07/20/2007 Document Revised: 10/13/2014 Document Reviewed: 01/24/2014 Elsevier Interactive Patient Education [employer]2016 Elsevier Inc.    Your MRI today does not  indicate any need for surgical intervention as you do not have any problems with your spine or nerves being compressed.  Take your next dose of prednisone tomorrow morning.  Use the the other medicines as directed.  Do not drive within 4 hours of taking hydrocodone as this will make you drowsy.  Avoid lifting,  Bending,  Twisting or any other activity that worsens your pain over the next week.  Apply an  icepack  to your lower back for 10-15 minutes every 2 hours for the next 2 days.  You should get rechecked if your symptoms are not better over the next 5 days.  Call the clinic listed above to establish primary care.  I also suggest following up with [facility] for financial assistance as discussed.

## 2016-01-31 NOTE — ED Notes (Signed)
Pt states he has chronic back pain he takes Neurontin for and it has been worse for the past 2 days.  Denies injury.

## 2016-01-31 NOTE — ED Provider Notes (Signed)
CSN: [id number]     Arrival date & time 01/31/16  4098 History   First MD Initiated Contact with Patient 01/31/16 [phone]     No chief complaint on file.    (Consider location/radiation/quality/duration/timing/severity/associated sxs/prior Treatment) The history is provided by the patient and the spouse.   Brian West is a 45 y.o. male with a history of chronic low back pain with radiculopathy into his right leg presenting with worse pain along with weakness in the right leg and several episodes of fall secondary to this weakness since starting a new job where he has been on his feet and lifting light objects.  He had an MRI last year with degenerative disc changes and had been recommended for surgery, but patient deferred at that time.  He endorses worsening symptoms, severe today with difficulty moving and walking.  He denies urinary or fecal incontinence or retention.  He has an old prescription of gabapentin and has started taking this medication again this week without relief of symptoms.  He denies fevers or chills.   Past Medical History  Diagnosis Date  . Lumbar herniated disc    Past Surgical History  Procedure Laterality Date  . Appendectomy     Family History  Problem Relation Age of Onset  . Cancer Mother   . Diabetes Mother    Social History  Substance Use Topics  . Smoking status: Current Every Day Smoker -- 1.00 packs/day    Types: Cigarettes  . Smokeless tobacco: Former Neurosurgeon  . Alcohol Use: No    Review of Systems  Constitutional: Negative for fever.  Respiratory: Negative for shortness of breath.   Cardiovascular: Negative for chest pain and leg swelling.  Gastrointestinal: Negative for abdominal pain, constipation and abdominal distention.  Genitourinary: Negative for dysuria, urgency, frequency, flank pain and difficulty urinating.  Musculoskeletal: Positive for back pain. Negative for joint swelling and gait problem.  Skin: Negative for rash.   Neurological: Positive for weakness and numbness.      Allergies  Flexeril and Ibuprofen  Home Medications   Prior to Admission medications   Medication Sig Start Date End Date Taking? Authorizing Provider  gabapentin (NEURONTIN) 300 MG capsule Take 300 mg by mouth daily.   Yes Historical Provider, MD  HYDROcodone-acetaminophen (NORCO/VICODIN) 5-325 MG tablet Take 1 tablet by mouth every 4 (four) hours as needed. 01/31/16   Burgess Amor, PA-C  predniSONE (DELTASONE) 10 MG tablet 6, 5, 4, 3, 2 then 1 tablet by mouth daily for 6 days total. 01/31/16   Burgess Amor, PA-C   BP 107/95 mmHg  Pulse 66  Temp(Src) 97.5 F (36.4 C) (Oral)  SpO2 98% Physical Exam  Constitutional: He appears well-developed and well-nourished.  HENT:  Head: Normocephalic.  Eyes: Conjunctivae are normal.  Neck: Normal range of motion. Neck supple.  Cardiovascular: Normal rate and intact distal pulses.   Pedal pulses normal.  Pulmonary/Chest: Effort normal.  Abdominal: Soft. Bowel sounds are normal. He exhibits no distension and no mass.  Musculoskeletal: Normal range of motion. He exhibits no edema.       Lumbar back: He exhibits tenderness. He exhibits no swelling, no edema and no spasm.  Positive SLR right.    Neurological: He is alert. He displays no atrophy and no tremor. No sensory deficit. He exhibits normal muscle tone. Gait normal.  Reflex Scores:      Patellar reflexes are 1+ on the right side and 2+ on the left side. Right knee and hip 5/5 strength  with flexion.   Right ankle 3/5 strength, left 5/5.  Skin: Skin is warm and dry.  Psychiatric: He has a normal mood and affect.  Nursing note and vitals reviewed.   ED Course  Procedures (including critical care time) Labs Review Labs Reviewed - No data to display  Imaging Review Mr Lumbar Spine Wo Contrast  01/31/2016  CLINICAL DATA:  Low back pain radiating into the right leg for 2 weeks. No known injury. Initial encounter. EXAM: MRI LUMBAR  SPINE WITHOUT CONTRAST TECHNIQUE: Multiplanar, multisequence MR imaging of the lumbar spine was performed. No intravenous contrast was administered. COMPARISON:  MRI lumbar spine 01/24/2015. FINDINGS: Vertebral body height and alignment are maintained. Marrow signal is unremarkable. The conus medullaris is normal in signal and position. Imaged intra-abdominal contents appear normal. T11-12 is imaged in the sagittal plane only and negative. T12-L1:  Negative. L1-2:  Negative. L2-3:  Negative. L3-4:  Negative. L4-5: Shallow disc bulge and ligamentum flavum thickening are again seen. The central spinal canal and neural foramina remain open. The appearance is unchanged. L5-S1:  Negative. IMPRESSION: No change in the appearance of the lumbar spine. Mild disc bulge at L4-5 without central canal or foraminal narrowing is unchanged. Electronically Signed   By: Drusilla Kannerhomas  Dalessio M.D.   On: 01/31/2016 09:54   I have personally reviewed and evaluated these images and lab results as part of my medical decision-making.   EKG Interpretation None      MDM   Final diagnoses:  Peripheral neuropathy (HCC)  DDD (degenerative disc disease), lumbar    Patient with right-sided weakness per history and on exam, however MRI is positive only for shallow disc bulge at L4-L5 without spinal canal stenosis.  Activity as tolerated.  Patient was placed on a prednisone taper, hydrocodone prescribed.  He was given referral to try to [facility] for obtaining primary care.  Additionally he was referred to Dr. Lovell SheehanJenkins for further evaluation of his symptoms.    Discussed with Dr. Adriana Simasook prior to discharge home.    Burgess AmorJulie Zymeir Salminen, PA-C 01/31/16 1105  Donnetta HutchingBrian Cook, MD 01/31/16 [phone]

## 2016-05-12 ENCOUNTER — Emergency Department (HOSPITAL_COMMUNITY)
Admission: EM | Admit: 2016-05-12 | Discharge: 2016-05-14 | Disposition: A | Discharge status: Other Acute Inpatient Hospital | Payer: Self-pay

## 2016-05-12 ENCOUNTER — Encounter (HOSPITAL_COMMUNITY): Payer: Self-pay | Admitting: Emergency Medicine

## 2016-05-12 DIAGNOSIS — F251 Schizoaffective disorder, depressive type: Secondary | ICD-10-CM

## 2016-05-12 DIAGNOSIS — R45851 Suicidal ideations: Secondary | ICD-10-CM | POA: Insufficient documentation

## 2016-05-12 DIAGNOSIS — F17210 Nicotine dependence, cigarettes, uncomplicated: Secondary | ICD-10-CM | POA: Insufficient documentation

## 2016-05-12 LAB — CBC
HEMATOCRIT: 36.7 % — AB (ref 39.0–52.0)
Hemoglobin: 12.8 g/dL — ABNORMAL LOW (ref 13.0–17.0)
MCH: 31.3 pg (ref 26.0–34.0)
MCHC: 34.9 g/dL (ref 30.0–36.0)
MCV: 89.7 fL (ref 78.0–100.0)
PLATELETS: 264 10*3/uL (ref 150–400)
RBC: 4.09 MIL/uL — ABNORMAL LOW (ref 4.22–5.81)
RDW: 13.1 % (ref 11.5–15.5)
WBC: 7.5 10*3/uL (ref 4.0–10.5)

## 2016-05-12 LAB — COMPREHENSIVE METABOLIC PANEL
ALBUMIN: 4 g/dL (ref 3.5–5.0)
ALT: 21 U/L (ref 17–63)
ANION GAP: 6 (ref 5–15)
AST: 35 U/L (ref 15–41)
Alkaline Phosphatase: 128 U/L — ABNORMAL HIGH (ref 38–126)
BILIRUBIN TOTAL: 0.4 mg/dL (ref 0.3–1.2)
BUN: 9 mg/dL (ref 6–20)
CO2: 25 mmol/L (ref 22–32)
Calcium: 8.5 mg/dL — ABNORMAL LOW (ref 8.9–10.3)
Chloride: 104 mmol/L (ref 101–111)
Creatinine, Ser: 0.97 mg/dL (ref 0.61–1.24)
GFR calc non Af Amer: >60 mL/min (ref >60)
GLUCOSE: 136 mg/dL — AB (ref 65–99)
POTASSIUM: 3.4 mmol/L — AB (ref 3.5–5.1)
SODIUM: 135 mmol/L (ref 135–145)
TOTAL PROTEIN: 7 g/dL (ref 6.5–8.1)

## 2016-05-12 LAB — RAPID URINE DRUG SCREEN, HOSP PERFORMED
Amphetamines: POSITIVE — AB
BARBITURATES: NOT DETECTED
BENZODIAZEPINES: NOT DETECTED
COCAINE: POSITIVE — AB
OPIATES: NOT DETECTED
Tetrahydrocannabinol: NOT DETECTED

## 2016-05-12 LAB — ETHANOL: Alcohol, Ethyl (B): <5 mg/dL (ref <5)

## 2016-05-12 LAB — SALICYLATE LEVEL

## 2016-05-12 LAB — ACETAMINOPHEN LEVEL

## 2016-05-12 MED ORDER — LORAZEPAM 1 MG PO TABS
1.0000 mg | ORAL_TABLET | ORAL | Status: DC | PRN
  Administered 2016-05-12: 1 mg via ORAL
  Filled 2016-05-12: qty 1

## 2016-05-12 MED ORDER — ACETAMINOPHEN 325 MG PO TABS
650.0000 mg | ORAL_TABLET | ORAL | Status: DC | PRN
  Administered 2016-05-13: 650 mg via ORAL
  Filled 2016-05-12: qty 2

## 2016-05-12 MED ORDER — VENLAFAXINE HCL 25 MG PO TABS
ORAL_TABLET | ORAL | Status: AC
  Filled 2016-05-12: qty 3

## 2016-05-12 MED ORDER — BUSPIRONE HCL 5 MG PO TABS
ORAL_TABLET | ORAL | Status: AC
  Filled 2016-05-12: qty 2

## 2016-05-12 MED ORDER — BUSPIRONE HCL 5 MG PO TABS
10.0000 mg | ORAL_TABLET | Freq: Two times a day (BID) | ORAL | Status: DC
Start: 2016-05-12 — End: 2016-05-14
  Administered 2016-05-12 – 2016-05-14 (×4): 10 mg via ORAL
  Filled 2016-05-12 (×8): qty 2

## 2016-05-12 MED ORDER — VENLAFAXINE HCL 75 MG PO TABS
75.0000 mg | ORAL_TABLET | Freq: Two times a day (BID) | ORAL | Status: DC
  Administered 2016-05-12 – 2016-05-14 (×4): 75 mg via ORAL
  Filled 2016-05-12 (×9): qty 1

## 2016-05-12 MED ORDER — TRAZODONE HCL 50 MG PO TABS
150.0000 mg | ORAL_TABLET | Freq: Every day | ORAL | Status: DC
  Administered 2016-05-12 – 2016-05-13 (×2): 150 mg via ORAL
  Filled 2016-05-12: qty 3
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
## 2016-05-12 NOTE — ED Provider Notes (Signed)
[facility] Provider Note   CSN: [id number] Arrival date & time: 05/12/16  1656  First Provider Contact:  First MD Initiated Contact with Patient 05/12/16 1730        History   Chief Complaint Chief Complaint  Patient presents with  . V70.1    HPI Brian West is a 45 y.o. male.  The history is provided by the patient.  Patient presents with mental health complaint He reports he is hearing voices, and he is very paranoid He reports the voices are telling him to harm himself He is feeling worse He has not made a suicide attempt as of yet He has missed recent psych appointments as he feels he is not benefiting from them His course is worsening Nothing improves his symptoms  No HA/CP/SOB No fever/vomiting He does report recent drug use   Past Medical History:  Diagnosis Date  . Lumbar herniated disc     There are no active problems to display for this patient.   Past Surgical History:  Procedure Laterality Date  . APPENDECTOMY         Home Medications    Prior to Admission medications   Medication Sig Start Date End Date Taking? Authorizing Provider  gabapentin (NEURONTIN) 300 MG capsule Take 300 mg by mouth daily.    Historical Provider, MD  HYDROcodone-acetaminophen (NORCO/VICODIN) 5-325 MG tablet Take 1 tablet by mouth every 4 (four) hours as needed. 01/31/16   Burgess Amor, PA-C  predniSONE (DELTASONE) 10 MG tablet 6, 5, 4, 3, 2 then 1 tablet by mouth daily for 6 days total. 01/31/16   Burgess Amor, PA-C    Family History Family History  Problem Relation Age of Onset  . Cancer Mother   . Diabetes Mother     Social History Social History  Substance Use Topics  . Smoking status: Current Every Day Smoker    Packs/day: 1.00    Types: Cigarettes  . Smokeless tobacco: Former Neurosurgeon  . Alcohol use No     Allergies   Flexeril [cyclobenzaprine] and Ibuprofen   Review of Systems Review of Systems  Constitutional: Negative for fever.    Respiratory: Negative for shortness of breath.   Cardiovascular: Negative for chest pain.  Neurological: Negative for headaches.  Psychiatric/Behavioral: Positive for suicidal ideas. The patient is nervous/anxious.   All other systems reviewed and are negative.    Physical Exam Updated Vital Signs BP 123/78 (BP Location: Left Arm)   Pulse 76   Temp 98.2 F (36.8 C) (Oral)   Resp 16   Ht 6' (1.829 m)   Wt 72.6 kg   SpO2 100%   BMI 21.70 kg/m   Physical Exam CONSTITUTIONAL: Disheveled, anxious HEAD: Normocephalic/atraumatic EYES: EOMI ENMT: Mucous membranes moist NECK: supple no meningeal signs SPINE/BACK:entire spine nontender CV: S1/S2 noted, no murmurs/rubs/gallops noted LUNGS: Lungs are clear to auscultation bilaterally, no apparent distress ABDOMEN: soft, nontender NEURO: Pt is awake/alert/appropriate, moves all extremitiesx4.  No facial droop.   EXTREMITIES: pulses normal/equal, full ROM SKIN: warm, color normal PSYCH: anxious   ED Treatments / Results  Labs (all labs ordered are listed, but only abnormal results are displayed) Labs Reviewed  COMPREHENSIVE METABOLIC PANEL - Abnormal; Notable for the following:       Result Value   Potassium 3.4 (*)    Glucose, Bld 136 (*)    Calcium 8.5 (*)    Alkaline Phosphatase 128 (*)    All other components within normal limits  ACETAMINOPHEN LEVEL -  Abnormal; Notable for the following:    Acetaminophen (Tylenol), Serum <10 (*)    All other components within normal limits  CBC - Abnormal; Notable for the following:    RBC 4.09 (*)    Hemoglobin 12.8 (*)    HCT 36.7 (*)    All other components within normal limits  URINE RAPID DRUG SCREEN, HOSP PERFORMED - Abnormal; Notable for the following:    Cocaine POSITIVE (*)    Amphetamines POSITIVE (*)    All other components within normal limits  ETHANOL  SALICYLATE LEVEL    EKG  EKG Interpretation None       Radiology No results  found.  Procedures Procedures (including critical care time)  Medications Ordered in ED Medications  traZODone (DESYREL) tablet 150 mg (not administered)  busPIRone (BUSPAR) tablet 10 mg (not administered)  venlafaxine (EFFEXOR) tablet 75 mg (not administered)  acetaminophen (TYLENOL) tablet 650 mg (not administered)  LORazepam (ATIVAN) tablet 1 mg (1 mg Oral Given 05/12/16 1830)     Initial Impression / Assessment and Plan / ED Course  I have reviewed the triage vital signs and the nursing notes.  Pertinent labs & imaging results that were available during my care of the patient were reviewed by me and considered in my medical decision making (see chart for details).  Clinical Course    Pt here with hallucinations, increased anxiety and concern he may harm himself He has psychiatric history He is medically stable at this time 6:58 PM Pt seen by psych Awaiting placement   Final Clinical Impressions(s) / ED Diagnoses   Final diagnoses:  Suicidal ideation    New Prescriptions New Prescriptions   No medications on file     Zadie Rhineonald Addison Freimuth, MD 05/12/16 1858

## 2016-05-12 NOTE — ED Triage Notes (Addendum)
Pt reports missed last two appointments at [facility]. Pt reports paranoia, SI/HI/voices at this time. Pt denies current plan and reports auditory voices. nad noted. Pt alert and oriented,tearful but cooperative.

## 2016-05-12 NOTE — Progress Notes (Signed)
Patient meets criteria for psychiatric inpatient treatment on 05/12/16, per Fransisca KaufmannLaura Davis NP.  Referrals have been sent out to: [facility], [facility], [facility], [facility], [facility].  CSW will continue to follow up and seek placement.  [place] Abtsatia Mcgwire Dasaro, LCSWA Disposition staff 05/12/2016 8:07 PM

## 2016-05-13 DIAGNOSIS — F251 Schizoaffective disorder, depressive type: Secondary | ICD-10-CM

## 2016-05-13 NOTE — Progress Notes (Signed)
  [facility], [facility].  Referral has been followed up at the following hospitals: [facility] - per Elmarie Shileyiffany, will call back, name and phone number provided. [facility] - per Shanda BumpsJessica, didn't get a chance to look at it yet. [facility] - left voicemail. [facility] - per ClaytonAysha, resend it. Referral faxed with updated vitals. [facility] - left voicemail for counselor Cedrick  Turner DanielsRowan - left voicemail with name and call back number.  CSW will continue to follow up.  [place] Abtsatia Yobany Vroom, LCSWA Disposition staff 05/13/2016 6:38 PM

## 2016-05-13 NOTE — Progress Notes (Addendum)
LCSW attempted to call wife to obtain more information regarding patient's current behaviors, medications he was taking, and understand more of the social situation.  Unable to reach wife: Crystal, will attempt to reach her and continue to seek placement. Currently BH is at capacity for 500 hall bed and patient has been referred out.  Consulted with NP who is going to reassess patient and review medications.  [facility].    Will continue to follow.  Deretha EmoryHannah Lillyanna Glandon LCSW, MSW Clinical Social Work: [employer] [phone]

## 2016-05-13 NOTE — ED Notes (Signed)
Pt ambulated to the bathroom accompanied by sitter. NAD noted
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
## 2016-05-13 NOTE — ED Notes (Signed)
Pt verbalizes he has a headache. Pt given tylenol.    Pt has a meal tray.

## 2016-05-13 NOTE — ED Notes (Signed)
0800 meds not in pyxis. Pharmacy made aware.

## 2016-05-14 ENCOUNTER — Inpatient Hospital Stay (HOSPITAL_COMMUNITY)
Admission: AD | Admit: 2016-05-14 | Discharge: 2016-05-21 | DRG: 885 | Disposition: A | Discharge status: Home / Self Care | Payer: No Typology Code available for payment source | Source: Intra-hospital | Attending: Psychiatry | Admitting: Psychiatry

## 2016-05-14 ENCOUNTER — Encounter (HOSPITAL_COMMUNITY): Payer: Self-pay | Admitting: *Deleted

## 2016-05-14 DIAGNOSIS — F332 Major depressive disorder, recurrent severe without psychotic features: Secondary | ICD-10-CM | POA: Diagnosis present

## 2016-05-14 DIAGNOSIS — F1123 Opioid dependence with withdrawal: Secondary | ICD-10-CM | POA: Diagnosis present

## 2016-05-14 DIAGNOSIS — Z9114 Patient's other noncompliance with medication regimen: Secondary | ICD-10-CM | POA: Diagnosis not present

## 2016-05-14 DIAGNOSIS — Z818 Family history of other mental and behavioral disorders: Secondary | ICD-10-CM

## 2016-05-14 DIAGNOSIS — I951 Orthostatic hypotension: Secondary | ICD-10-CM | POA: Diagnosis present

## 2016-05-14 DIAGNOSIS — Z833 Family history of diabetes mellitus: Secondary | ICD-10-CM | POA: Diagnosis not present

## 2016-05-14 DIAGNOSIS — F411 Generalized anxiety disorder: Secondary | ICD-10-CM | POA: Diagnosis present

## 2016-05-14 DIAGNOSIS — F251 Schizoaffective disorder, depressive type: Secondary | ICD-10-CM | POA: Diagnosis present

## 2016-05-14 DIAGNOSIS — Z915 Personal history of self-harm: Secondary | ICD-10-CM

## 2016-05-14 DIAGNOSIS — F1120 Opioid dependence, uncomplicated: Secondary | ICD-10-CM | POA: Diagnosis present

## 2016-05-14 DIAGNOSIS — Z8659 Personal history of other mental and behavioral disorders: Secondary | ICD-10-CM

## 2016-05-14 DIAGNOSIS — G4700 Insomnia, unspecified: Secondary | ICD-10-CM | POA: Diagnosis present

## 2016-05-14 DIAGNOSIS — R45851 Suicidal ideations: Secondary | ICD-10-CM | POA: Diagnosis not present

## 2016-05-14 DIAGNOSIS — F1590 Other stimulant use, unspecified, uncomplicated: Secondary | ICD-10-CM

## 2016-05-14 DIAGNOSIS — F1410 Cocaine abuse, uncomplicated: Secondary | ICD-10-CM | POA: Diagnosis present

## 2016-05-14 DIAGNOSIS — F17210 Nicotine dependence, cigarettes, uncomplicated: Secondary | ICD-10-CM | POA: Diagnosis present

## 2016-05-14 HISTORY — DX: Anxiety disorder, unspecified: F41.9

## 2016-05-14 MED ORDER — ZIPRASIDONE MESYLATE 20 MG IM SOLR: 20.0000 mg | INTRAMUSCULAR | Status: DC | PRN

## 2016-05-14 MED ORDER — ALUM & MAG HYDROXIDE-SIMETH 200-200-20 MG/5ML PO SUSP: 30.0000 mL | ORAL | Status: DC | PRN

## 2016-05-14 MED ORDER — LORAZEPAM 1 MG PO TABS: 1.0000 mg | ORAL_TABLET | ORAL | Status: DC | PRN

## 2016-05-14 MED ORDER — TRAZODONE HCL 50 MG PO TABS
50.0000 mg | ORAL_TABLET | Freq: Every evening | ORAL | Status: DC | PRN
  Filled 2016-05-14 (×5): qty 1

## 2016-05-14 MED ORDER — HYDROXYZINE HCL 25 MG PO TABS: 25.0000 mg | ORAL_TABLET | Freq: Four times a day (QID) | ORAL | Status: DC | PRN

## 2016-05-14 MED ORDER — MAGNESIUM HYDROXIDE 400 MG/5ML PO SUSP: 30.0000 mL | Freq: Every day | ORAL | Status: DC | PRN

## 2016-05-14 MED ORDER — ACETAMINOPHEN 325 MG PO TABS
650.0000 mg | ORAL_TABLET | Freq: Four times a day (QID) | ORAL | Status: DC | PRN
  Administered 2016-05-17 – 2016-05-19 (×2): 650 mg via ORAL
  Filled 2016-05-14 (×2): qty 2

## 2016-05-14 MED ORDER — RISPERIDONE 2 MG PO TBDP
2.0000 mg | ORAL_TABLET | Freq: Three times a day (TID) | ORAL | Status: DC | PRN
Start: 2016-05-14 — End: 2016-05-15
  Administered 2016-05-14: 2 mg via ORAL
  Filled 2016-05-14: qty 2

## 2016-05-14 NOTE — ED Notes (Signed)
Patient denies pain and is resting comfortably.  

## 2016-05-14 NOTE — BH Assessment (Signed)
[facility] MD Eappen, room [id number], call report to [phone].

## 2016-05-14 NOTE — ED Notes (Signed)
Patient resting, eyes closed. Even rise and fall of chest. NAD noted at this time

## 2016-05-14 NOTE — ED Provider Notes (Signed)
Patient without complaint in the emergency room. Arrangements made for transfer to behavioral health hospital. After evaluation he was deemed acceptable for inpatient care and has been accepted there by Dr. Mariam DollarEapen. Transfer arrangements are being made currently.   Brian PorterMark Quindon Denker, MD 05/14/16 1302

## 2016-05-14 NOTE — Tx Team (Signed)
Initial Interdisciplinary Treatment Plan   PATIENT STRESSORS: Financial difficulties Medication change or noncompliance Substance abuse Paranoia   PATIENT STRENGTHS: Average or above average intelligence Communication skills Motivation for treatment/growth Physical Health Supportive family/friends   PROBLEM LIST: Problem List/Patient Goals Date to be addressed Date deferred Reason deferred Estimated date of resolution  At risk for suicide 05/14/2016  05/14/2016   D/C  Substance Abuse 05/14/2016  05/14/2016   D/C  Depression 05/14/2016  05/14/2016   D/C  "Get medication adjusted and worked out" 05/14/2016  05/14/2016   D/C  "Get to feeling better" 05/14/2016  05/14/2016   D/C                           DISCHARGE CRITERIA:  Ability to meet basic life and health needs Improved stabilization in mood, thinking, and/or behavior Motivation to continue treatment in a less acute level of care Need for constant or close observation no longer present Reduction of life-threatening or endangering symptoms to within safe limits Verbal commitment to aftercare and medication compliance Withdrawal symptoms are absent or subacute and managed without 24-hour nursing intervention  PRELIMINARY DISCHARGE PLAN: Attend 12-step recovery group Outpatient therapy Return to previous living arrangement  PATIENT/FAMIILY INVOLVEMENT: This treatment plan has been presented to and reviewed with the patient, [name].  The patient and family have been given the opportunity to ask questions and make suggestions.  Larry SierrasMiddleton, Dorine Duffey P 05/14/2016, 3:26 PM

## 2016-05-14 NOTE — Progress Notes (Signed)
Admission Note:  45 yr old male who presents voluntary, in no acute distress, for the treatment of psychosis, SI, substance abuse, and depression. Patient appears anxious and depressed. Patient was cooperative with admission process. Patient presents with passive SI and contracts for safety upon admission.  Patient reports feelings of paranoia stating "I feel like people are talking about me or plotting against me. I feel like my wife is cheating on me and sneaking people in the house".  Patient reports AVH stating "becuause I believe my wife is sneaking people in the house, I think she is hiding people in the couch or the bed, which I know is not true, and I think I see the bed moving or the couch moving with people inside and I hear a man's deep voice but can't make out the words".  Patient reports that he has been off of this medications for "2 months" due to "don't like the side effects. I can't get off sexually with my wife and my eyes shake back and fourth".  Patient reports hx of substance abuse. Patient states "I was clean and sober for 10-12 years but have been dabbbling for the past 3 weeks".  Patient reports that he has been introduced to "needles" and have been "shooting up meth and coke".  Patient reports that he is currently taking Suboxin.  Patient reports prior suicide attempt via OD on pills "In the 90's".  [facility], patient would like to "get medication adjusted and worked out" and "Get to feeling better".  Skin was assessed and found to be clear of any abnormal marks apart from scratches on right arm, rash on left ankle from possible "posion oak", and multiple tattoos.  Patient searched and no contraband found, POC and unit policies explained and understanding verbalized. Consents obtained. Belongings searched and items placed in locker #45.  Food and fluids offered, and fluids accepted.  Report given to accepting nurse. Patient placed on q 15 minute safety checks. Patient had no additional  questions or concerns. Safety maintained on unit.

## 2016-05-15 MED ORDER — DICYCLOMINE HCL 20 MG PO TABS
20.0000 mg | ORAL_TABLET | Freq: Four times a day (QID) | ORAL | Status: AC | PRN
  Administered 2016-05-15 – 2016-05-17 (×2): 20 mg via ORAL
  Filled 2016-05-15 (×2): qty 1

## 2016-05-15 MED ORDER — CLONIDINE HCL 0.1 MG PO TABS
0.1000 mg | ORAL_TABLET | ORAL | Status: AC
  Administered 2016-05-17 – 2016-05-18 (×3): 0.1 mg via ORAL
  Filled 2016-05-15 (×4): qty 1

## 2016-05-15 MED ORDER — OLANZAPINE 2.5 MG PO TABS: 2.5000 mg | ORAL_TABLET | Freq: Three times a day (TID) | ORAL | Status: DC | PRN

## 2016-05-15 MED ORDER — METHOCARBAMOL 500 MG PO TABS
500.0000 mg | ORAL_TABLET | Freq: Three times a day (TID) | ORAL | Status: AC | PRN
  Administered 2016-05-15 – 2016-05-18 (×2): 500 mg via ORAL
  Filled 2016-05-15 (×2): qty 1

## 2016-05-15 MED ORDER — CLONIDINE HCL 0.1 MG PO TABS
0.1000 mg | ORAL_TABLET | Freq: Every day | ORAL | Status: AC
  Administered 2016-05-19 – 2016-05-20 (×2): 0.1 mg via ORAL
  Filled 2016-05-15 (×3): qty 1

## 2016-05-15 MED ORDER — TRAZODONE HCL 50 MG PO TABS
50.0000 mg | ORAL_TABLET | Freq: Every evening | ORAL | Status: DC | PRN
Start: 2016-05-15 — End: 2016-05-21
  Administered 2016-05-15 – 2016-05-20 (×4): 50 mg via ORAL
  Filled 2016-05-15 (×5): qty 1

## 2016-05-15 MED ORDER — ONDANSETRON 4 MG PO TBDP: 4.0000 mg | ORAL_TABLET | Freq: Four times a day (QID) | ORAL | Status: AC | PRN

## 2016-05-15 MED ORDER — LOPERAMIDE HCL 2 MG PO CAPS: 2.0000 mg | ORAL_CAPSULE | ORAL | Status: AC | PRN

## 2016-05-15 MED ORDER — HYDROXYZINE HCL 25 MG PO TABS
25.0000 mg | ORAL_TABLET | Freq: Four times a day (QID) | ORAL | Status: AC | PRN
  Administered 2016-05-15 – 2016-05-19 (×5): 25 mg via ORAL
  Filled 2016-05-15 (×5): qty 1

## 2016-05-15 MED ORDER — OLANZAPINE 2.5 MG PO TABS
2.5000 mg | ORAL_TABLET | Freq: Every day | ORAL | Status: DC
  Administered 2016-05-15 – 2016-05-16 (×2): 2.5 mg via ORAL
  Filled 2016-05-15 (×4): qty 1

## 2016-05-15 MED ORDER — CLONIDINE HCL 0.1 MG PO TABS
0.1000 mg | ORAL_TABLET | Freq: Four times a day (QID) | ORAL | Status: AC
  Administered 2016-05-15 – 2016-05-16 (×5): 0.1 mg via ORAL
  Filled 2016-05-15 (×7): qty 1

## 2016-05-15 NOTE — [facility] Group Notes (Signed)
[facility] Group Notes:  (Counselor/Nursing/MHT/Case Management/Adjunct)  05/15/2016 1:15PM  Type of Therapy:  Group Therapy  Participation Level:  Active  Participation Quality:  Appropriate  Affect:  Flat  Cognitive:  Oriented  Insight:  Improving  Engagement in Group:  Limited  Engagement in Therapy:  Limited  Modes of Intervention:  Discussion, Exploration and Socialization  Summary of Progress/Problems: The topic for group was balance in life.  Pt participated in the discussion about when their life was in balance and out of balance and how this feels.  Pt discussed ways to get back in balance and short term goals they can work on to get where they want to be. Stayed for about half of the group.  Stated he is somewhere between balance and unbalance.  "It's hard bing here because I can't stay occupied like I do at home.  I'm always doing dishes, laundry and cleaning up."   Ida Rogueorth, Terrika Zuver B 05/15/2016 3:57 PM

## 2016-05-15 NOTE — Progress Notes (Signed)
DAR NOTE: Patient present with flat affect and depressed mood.  Patient appears nervous and anxious.  Patient reports withdrawal symptoms of chills, diarrhea, agitation, irritability, runny nose, fatigue, muscle aches, and restless legs.  Received Robaxin, Vistaril, and Bentyl for withdrawal symptoms. Described energy level as low and concentration as poor.  Rates depression at 5, hopelessness at 5, and anxiety at 8.  Patient maintained on routine safety checks.  Support and encouragement offered as needed.  Patient attended group and participated.  Minimal interaction with staff and peers.
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
## 2016-05-15 NOTE — Progress Notes (Signed)
D: Pt was flat, isolative and withdrawn self even while in the dayroom. Pt endorses severe anxiety, depression and hopelessness-Pt was unable to sit still. Pt also endorsed auditory hallucinations and paranoia; states, "They said they will be coming for me; I feel that there are people that are coming for me." Pt denied SI, HI and pain. A: Medications offered as prescribed.  Support, encouragement, and safe environment provided.  15-minute safety checks continue. R: Pt was med compliant.  Pt did not attend group. Safety checks continue.
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
## 2016-05-15 NOTE — Progress Notes (Addendum)
Patient ID: Brian West, male   DOB: Feb 14, 1971, 45 y.o.   MRN: [id number]  Pt currently presents with a blunted affect and depressed behavior. Pt reports to writer that their goal is to "focus on what is happening minute by minute and not be worried about what will happen in the future, things can change in the blink of an eye." Pt reports that he is doing better today than yesterday. Pt states "I had a good visit." Pt wife visited tonight. Pt reports good sleep with current medication regimen.   Pt provided with medications per providers orders. Pt's labs and vitals were monitored throughout the night. Pt supported emotionally and encouraged to express concerns and questions. Pt educated on medications and assertiveness techniques. Pt moderate fall risk, all signage checked.   Pt's safety ensured with 15 minute and environmental checks. Pt currently denies SI/HI and A/V hallucinations. Pt verbally agrees to seek staff if SI/HI or A/VH occurs and to consult with staff before acting on any harmful thoughts. Pts wife concerned about the affordability of Zyprexa at discharge, states "You know he doesn't have any insurance so..." Will continue POC.

## 2016-05-15 NOTE — H&P (Signed)
Psychiatric Admission Assessment Adult  Patient Identification: Brian West MRN:  [id number] Date of Evaluation:  05/15/2016 Chief Complaint:  Schizoaffective Disorder Depressive type Principal Diagnosis: Schizoaffective disorder, depressive type (HCC) Diagnosis:   Patient Active Problem List   Diagnosis Date Noted  . MDD (major depressive disorder), recurrent severe, without psychosis (HCC) [F33.2] 05/14/2016  . Schizoaffective disorder, depressive type (HCC) [F25.1] 05/13/2016   History of Present Illness:  45 year old man with schizophrenia, presented to ED complaining of AH and paranoia in the setting of non adherence to medication.   He states that he does not feel good. He reports he has been hearing voices after he stopped taking medication three months ago.  He used to be on Risperdal and Effexor, but these caused him sexual side effects, drowsiness and restless. He started to use cocaine and Methamphetamine 10 weeks ago to stop these voices. The voice told him that his wife is cheating. He also states that when he sees his wife seating in the couch and if there is space there, he is concerned that there might be other person in the room. He argued these with his wife and he was told that his wife might leave him if he does not get any medical help. He states that these are sometimes "so real" to him, and he could note tell what is the reality. He denies CAH. He denies VH. He denies depressed mood, but reports sometimes depression makes those hallucinations worse. Per report, he has been taking suboxone before coming to the hospital.   He denies persecutory delusion, ideas of reference or thought insertion. He does not recollect if he were tried olanzapine. He reports decreased need for sleep for 3 days in the past with mild euphoria and talkativeness, racing thought. He reports history of suicide attempt in 1999 by overdosing antidepressants. He was in mental health unit a couple  of times while he was incarcerated (He was in prison total of 23 years  per report). He reports a couple of times of previous [facility] [facility], last in 07/2014 while he was in prison.   Associated Signs/Symptoms: Depression Symptoms:  denies (Hypo) Manic Symptoms:  denies this time. history of decreased need for sleep for 3 days Anxiety Symptoms:  restless Psychotic Symptoms:  Hallucinations: Auditory other symptoms as above PTSD Symptoms: denies Total Time spent with patient: 45 minutes  Past Psychiatric History: Schizoaffective disorder   Is the patient at risk to self? Yes.    Has the patient been a risk to self in the past 6 months? No.  Has the patient been a risk to self within the distant past? No.  Is the patient a risk to others? No.  Has the patient been a risk to others in the past 6 months? No.  Has the patient been a risk to others within the distant past? No.   Prior Inpatient Therapy:   He reports a couple of times of [place] [facility] [facility], last in 07/2014 while he was in prison.  Prior Outpatient Therapy:   [facility]  Alcohol Screening: 1. How often do you have a drink containing alcohol?: Never 9. Have you or someone else been injured as a result of your drinking?: No 10. Has a relative or friend or a doctor or another health worker been concerned about your drinking or suggested you cut down?: No Alcohol Use Disorder Identification Test Final Score (AUDIT): 0 Brief Intervention: AUDIT score less  than 7 or less-screening does not suggest unhealthy drinking-brief intervention not indicated Substance Abuse History in the last 12 months:  Yes.   Consequences of Substance Abuse: Negative Previous Psychotropic Medications: Yes  "He reports having tried multiple medications in the past such as Haldol (jaw locked up), thorazine, doxepin, buspar, zyprexa, geodon."  Psychological Evaluations: Yes  Past Medical History:   Past Medical History:  Diagnosis Date  . Anxiety   . Lumbar herniated disc     Past Surgical History:  Procedure Laterality Date  . APPENDECTOMY     Family History:  Family History  Problem Relation Age of Onset  . Cancer Mother   . Diabetes Mother    Family Psychiatric  History: grandfather: suicide attempt, mother: depression Tobacco Screening: Have you used any form of tobacco in the last 30 days? (Cigarettes, Smokeless Tobacco, Cigars, and/or Pipes): Yes Tobacco use, Select all that apply: 5 or more cigarettes per day Are you interested in Tobacco Cessation Medications?: No, patient refused Counseled patient on smoking cessation including recognizing danger situations, developing coping skills and basic information about quitting provided: Refused/Declined practical counseling Social History:  History  Alcohol Use No     History  Drug Use  . Types: Cocaine, Methamphetamines    Comment: last use 05/09/16    Additional Social History: Marital status: Married Number of Years Married: 15 What types of issues is patient dealing with in the relationship?: we get into arguments Are you sexually active?: Yes What is your sexual orientation?: hetero Does patient have children?: Yes How many children?: 1 How is patient's relationship with their children?: 29 YO son  "He has real bad anger issues"       Lives with his wife of one year marriage (has been relationship more than ten years). He has 41 yo son.  He has been incarcerated eight times for break and entry. Last in 2008-2015.                   Allergies:   Allergies  Allergen Reactions  . Flexeril [Cyclobenzaprine] Other (See Comments)    insomnia  . Ibuprofen     States "it makes me bleed when I go to restroom"   Lab Results: No results found for this or any previous visit (from the past 48 hour(s)).  Blood Alcohol level:  Lab Results  Component Value Date   ETH <5 05/12/2016    Metabolic Disorder  Labs:  No results found for: HGBA1C, MPG No results found for: PROLACTIN No results found for: CHOL, TRIG, HDL, CHOLHDL, VLDL, LDLCALC  Current Medications: Current Facility-Administered Medications  Medication Dose Route Frequency Provider Last Rate Last Dose  . acetaminophen (TYLENOL) tablet 650 mg  650 mg Oral Q6H PRN Beau Fanny, FNP      . alum & mag hydroxide-simeth (MAALOX/MYLANTA) 200-200-20 MG/5ML suspension 30 mL  30 mL Oral Q4H PRN Beau Fanny, FNP      . cloNIDine (CATAPRES) tablet 0.1 mg  0.1 mg Oral QID Neysa Hotter, MD       Followed by  . [START ON 05/17/2016] cloNIDine (CATAPRES) tablet 0.1 mg  0.1 mg Oral BH-qamhs Neysa Hotter, MD       Followed by  . [START ON 05/19/2016] cloNIDine (CATAPRES) tablet 0.1 mg  0.1 mg Oral QAC breakfast Neysa Hotter, MD      . dicyclomine (BENTYL) tablet 20 mg  20 mg Oral Q6H PRN Neysa Hotter, MD      .  hydrOXYzine (ATARAX/VISTARIL) tablet 25 mg  25 mg Oral Q6H PRN Neysa Hotter, MD      . loperamide (IMODIUM) capsule 2-4 mg  2-4 mg Oral PRN Neysa Hotter, MD      . risperiDONE (RISPERDAL M-TABS) disintegrating tablet 2 mg  2 mg Oral Q8H PRN Kerry Hough, PA-C   2 mg at 05/14/16 2211   And  . LORazepam (ATIVAN) tablet 1 mg  1 mg Oral PRN Kerry Hough, PA-C       And  . ziprasidone (GEODON) injection 20 mg  20 mg Intramuscular PRN Kerry Hough, PA-C      . magnesium hydroxide (MILK OF MAGNESIA) suspension 30 mL  30 mL Oral Daily PRN Beau Fanny, FNP      . methocarbamol (ROBAXIN) tablet 500 mg  500 mg Oral Q8H PRN Neysa Hotter, MD      . OLANZapine (ZYPREXA) tablet 2.5 mg  2.5 mg Oral QHS Neysa Hotter, MD      . ondansetron (ZOFRAN-ODT) disintegrating tablet 4 mg  4 mg Oral Q6H PRN Neysa Hotter, MD      . traZODone (DESYREL) tablet 50 mg  50 mg Oral QHS,MR X 1 Kerry Hough, PA-C       PTA Medications: No prescriptions prior to admission.    Musculoskeletal: Strength & Muscle Tone: within normal limits Gait & Station:  normal Patient leans: N/A  Psychiatric Specialty Exam: Physical Exam  Constitutional: He is oriented to person, place, and time. He appears well-developed and well-nourished.  Eyes: EOM are normal.  Neurological: He is alert and oriented to person, place, and time.  No  Nystagmus, no rigidity  Skin: Skin is warm and dry.    Review of Systems  Constitutional: Positive for chills and malaise/fatigue.  Eyes: Negative for blurred vision.  Respiratory: Negative for shortness of breath.   Cardiovascular: Positive for palpitations. Negative for chest pain.  Gastrointestinal: Positive for diarrhea. Negative for nausea and vomiting.  Musculoskeletal: Positive for myalgias.  Neurological: Negative for dizziness.  Psychiatric/Behavioral: Positive for hallucinations and substance abuse. Negative for depression and suicidal ideas. The patient is nervous/anxious and has insomnia.     Blood pressure (!) 90/50, pulse 81, temperature 99.1 F (37.3 C), temperature source Oral, resp. rate 18, height 6' (1.829 m), weight 153 lb (69.4 kg), SpO2 100 %.Body mass index is 20.75 kg/m.  General Appearance: Fairly Groomed  Eye Contact:  Fair  Speech:  Normal Rate  Volume:  Normal  Mood:  Anxious  Affect:  restless, down, slightly constricted  Thought Process:  Coherent  Orientation:  Full (Time, Place, and Person)  Thought Content:  Logical and Paranoid Ideation  Suicidal Thoughts:  No  Homicidal Thoughts:  No  Memory:  intact  Judgement:  Fair  Insight:  Present  Psychomotor Activity:  Increased  Concentration:  Concentration: Fair and Attention Span: Fair  Recall:  [employer] of Knowledge:  Fair  Language:  Fair  Akathisia:  No  Handed:  Right  AIMS (if indicated):     Assets:  Communication Skills Desire for Improvement  ADL's:  Intact  Cognition:  WNL  Sleep:  Number of Hours: 6.25    Assessment 45 year old man with schizoaffective disorder, presented to ED complaining of AH and  paranoia in the setting of non adherence to medication, cocaine and methamphetamine use.   # Schizoaffective disorderd  Today's exam is notable for his paranoia of infidelity of his wife and  AH (wtihout CAH), although otherwise demonstrates linear thought process. Likely recent cocaine/methamphetamine use exacerbated his psychotic symptoms as well. He denies any mood symptoms on today's exam. Will start olanzapine to target his paranoia/AH. He has been calm and cooperative, and no  behavioral issues were addressed in the unit.   # Opioid withdrawal  He reports physical symptoms of opiate withdrawal. Will start on withdrawal protocol.   # Cocaine use disorder # Methamphetamine use disorder He is at contemplative stage. Will continue motivational interview.   Plans -Start olanzapine 2.5 mg qhs - Start olanzapine 2.5 mg q8hprn for agitation - Continue Trazodone 50 mg qhsprn for insomnia - Start opiate withdrawal protocol - Admit for crisis management and stabilization. - Medication management to reduce current symptoms to base line and improve the patient's overall level of functioning. - Monitor for the adverse effect of the medications and anger outbursts - Continue 15 minutes observation for safety concerns - Encouraged to participate in milieu therapy and group therapy counseling sessions and also work with coping skills -  Develop treatment plan to decrease risk of relapse upon discharge and to reduce the need for readmission. -  Psycho-social education regarding relapse prevention and self care. - Health care follow up as needed for medical problems.  Treatment Plan Summary: Daily contact with patient to assess and evaluate symptoms and progress in treatment  Observation Level/Precautions:  15 minute checks  Laboratory:  as needed  Psychotherapy:  Group and individual therapy  Medications:  As above  Consultations:  As needed  Discharge Concerns:  -  Estimated LOS: 5-7 days   Other:     I certify that inpatient services furnished can reasonably be expected to improve the patient's condition.    Neysa Hotter, MD 8/10/201712:54 PM

## 2016-05-15 NOTE — Tx Team (Signed)
Interdisciplinary Treatment Plan Update (Adult)  Date:  05/15/2016   Time Reviewed:  1:01 PM   Progress in Treatment: Attending groups: Yes. Participating in groups:  Yes. Taking medication as prescribed:  Yes. Tolerating medication:  Yes. Family/Significant other contact made:  No Patient understands diagnosis:  Yes  As evidenced by seeking help with psychosis, depression Discussing patient identified problems/goals with staff:  Yes, see initial care plan. Medical problems stabilized or resolved:  Yes. Denies suicidal/homicidal ideation: Yes. Issues/concerns per patient self-inventory:  No. Other:  New problem(s) identified:  Discharge Plan or Barriers: see below  Reason for Continuation of Hospitalization: Depression Hallucinations Medication stabilization Other; describe paranoia  Comments:  Estimated length of stay: 4-5 days  New goal(s):  Review of initial/current patient goals per problem list:   Review of initial/current patient goals per problem list:  1. Goal(s): Patient will participate in aftercare plan   Met: Yes   Target date: 3-5 days post admission date   As evidenced by: Patient will participate within aftercare plan AEB aftercare provider and housing plan at discharge being identified. 05/15/16:  Return home, follow up [facility]   2. Goal (s): Patient will exhibit decreased depressive symptoms and suicidal ideations.   Met: No   Target date: 3-5 days post admission date   As evidenced by: Patient will utilize self rating of depression at 3 or below and demonstrate decreased signs of depression or be deemed stable for discharge by MD. 05/15/16:  Rates depression a 7 today     4. Goal(s): Patient will demonstrate decreased signs of withdrawal due to substance abuse   Met: No   Target date: 3-5 days post admission date   As evidenced by: Patient will produce a CIWA/COWS score of 0, have stable vitals signs, and no symptoms of  withdrawal 05/15/16:  On clonodine detox protocol for withdrawal from suboxone, which he has been buying off the street.    5. Goal(s): Patient will demonstrate decreased signs of psychosis  * Met: No  * Target date: 3-5 days post admission date  * As evidenced by: Patient will demonstrate decreased frequency of AVH or return to baseline function 05/15/16:  C/O paranoia, VH, AH      Attendees: Patient:  05/15/2016 1:01 PM   Family:   05/15/2016 1:01 PM   Physician:  Ursula Alert, MD 05/15/2016 1:01 PM   Nursing: Hedy Jacob, RN 05/15/2016 1:01 PM   CSW:    Roque Lias, LCSW   05/15/2016 1:01 PM   Other:  05/15/2016 1:01 PM   Other:   05/15/2016 1:01 PM   Other:  Lars Pinks, Nurse CM 05/15/2016 1:01 PM   Other:   05/15/2016 1:01 PM   Other:  Norberto Sorenson, [place]  05/15/2016 1:01 PM   Other:  05/15/2016 1:01 PM   Other:  05/15/2016 1:01 PM   Other:  05/15/2016 1:01 PM   Other:  05/15/2016 1:01 PM   Other:  05/15/2016 1:01 PM   Other:   05/15/2016 1:01 PM    Scribe for Treatment Team:   Trish Mage, 05/15/2016 1:01 PM

## 2016-05-15 NOTE — Progress Notes (Signed)
Recreation Therapy Notes  Date: 05/15/16 Time: 1000 Location: [address] Dayroom  Group Topic: Leisure Education  Goal Area(s) Addresses:  Patient will identify positive leisure activities.  Patient will identify one positive benefit of participation in leisure activities.   Intervention: Magazines, scissors, glue sticks, construction paper  Activity: Got Rec?:  Patients were to look through magazines and cut out any pictures or words that described any positive leisure activities that they participate in or would like to participate in.  Patients were to take the pictures and words they found and glue them to the construction paper, creating a leisure collage.    Education:  Leisure Education, Discharge Planning  Education Outcome: Needs additional education  Clinical Observations/Feedback: Pt did not attend group.    Luster Hechler, LRT/CTRS  

## 2016-05-16 NOTE — [facility] Group Notes (Signed)
[facility] LCSW Group Therapy  05/16/2016  1:05 PM  Type of Therapy:  Group therapy  Participation Level:  Active  Participation Quality:  Attentive  Affect:  Flat  Cognitive:  Oriented  Insight:  Limited  Engagement in Therapy:  Limited  Modes of Intervention:  Discussion, Socialization  Summary of Progress/Problems:  Chaplain was here to lead a group on themes of hope and courage.  Invited.  Chose to not attend.  Ida Rogueorth, Emmett Arntz B 05/16/2016 12:48 PM

## 2016-05-16 NOTE — Progress Notes (Signed)
Recreation Therapy Notes  Date: 05/16/16 Time: 1015 Location: [address] Day Room  Group Topic: Stress Management  Goal Area(s) Addresses:  Patient will verbalize importance of using healthy stress management.  Patient will identify positive emotions associated with healthy stress management.   Intervention: Progressive Muscle Relaxation, Guided Imagery Script  Activity :  Progressive Muscle Relaxation, Peaceful Waves Scripts.  LRT introduced the techniques of PMR and Guided Imagery to the patients.  Patients were asked to follow along as LRT read scripts so they could participate in activity.  Education:  Stress Management, Discharge Planning.   Education Outcome: Needs additional education  Clinical Observations/Feedback: Pt did not attend group.   Bessy Reaney, LRT/CTRS  

## 2016-05-16 NOTE — Progress Notes (Signed)
Recreation Therapy Notes  INPATIENT RECREATION THERAPY ASSESSMENT  Patient Details Name: Shady R Ricciuti MRN: [id number] DOB: [birth year] Today's Date: 05/16/2016  Patient Stressors: Family, Relationship, Ave FilterWork  Pt stated he was here for H/I, paranoid, and get medications adjusted.  Coping Skills:   Isolate, Arguments, Substance Abuse, Avoidance, Music  Personal Challenges: Anger, Communication, Concentration, Decision-Making, Expressing Yourself, Problem-Solving, Relationships, Self-Esteem/Confidence, Social Interaction, Stress Management, Substance Abuse, Time Management, Trusting Others, Work Nutritional therapisterformance  Leisure Interests (2+):  Individual - Other (Comment) (Cleanning the car, play with dog)  Awareness of Community Resources:  Yes  Community Resources:  Park, Other (Comment) Building surveyor(Pool)  Current Use: Yes  Patient Strengths:  Primary school teacherGood worker, "loyal to my word"  Patient Identified Areas of Improvement:  Attitude, mental health  Current Recreation Participation:  Once a day  Patient Goal for Hospitalization:  "Get medications situated"  [place]  [place]   Current [place] (including self-harm):  No  Current HI:  No  Consent to Intern Participation: N/A  Caroll RancherMarjette Alexia Dinger, LRT/CTRS  Caroll RancherLindsay, Markelle Najarian A 05/16/2016, 2:59 PM

## 2016-05-16 NOTE — Progress Notes (Signed)
Nursing Progress Note: 7-7p  D- Mood is depressed and anxious.. Affect is blunted and appropriate. Pt is able to contract for safety. Pt has been dozing at intervals. Goal for today is to be more active.  A - Observed pt minimally interacting in group and in the milieu, Pt tends to isolate in room.Support and encouragement offered, safety maintained with q 15 minutes. Pt reports taking suboxin 1  8 mg bar a week that he divides throughout the week. States he's been doing this for 18 months rather than other pain medications.  R-Contracts for safety and continues to follow treatment plan, working on learning new coping skills. Encouraged to drink more fluids due to low H.R

## 2016-05-16 NOTE — Progress Notes (Signed)
Adult Psychoeducational Group Note  Date:  05/16/2016 Time:  9:25 PM  Group Topic/Focus:   Wrap-Up Group:   The focus of this group is to help patients review their daily goal of treatment and discuss progress on daily workbooks.   Participation Level:  Active  Participation Quality:  Appropriate  Affect:  Appropriate  Cognitive:  Alert  Insight: Appropriate  Engagement in Group:  Developing/Improving  Modes of Intervention:  Socialization and Support  Additional Comments:  Brian West participated in wrap-up group and shared information about his day. He reports taking all medications and completing necessary tasks so that he can be discharged. He also reports being sleepy and tired as he missed a few minutes of group goals earlier in the day. He reports eating all meals provided as well.   Brian West 05/16/2016, 9:25 PM

## 2016-05-16 NOTE — Progress Notes (Signed)
[facility]  05/16/2016 11:16 AM Brian West  MRN:  [id number] Subjective:   Patient seen, chart reviewed and case discussed with nursing staff.  -BP 89/56, HR 48 this morning (later 92/57, HR57)  He reports that he feels tired this morning but denies feeling depressed. His wife visited him last night; he thought it went well. He reports occasional paranoia regarding his wife, but denies any today. He had vague VH this morning but it has becoming less. He is concerned about the bills at home. He states that he used to buy  Suboxone from the street to be abstinent from opiates he used for his back pain (cutting 8 mg in 4 pieces). He states he could not afford seeing a provider to get Suboxone. He agrees not to take drugs/unprescribed medication. He denies insomnia.   He dizziness, palipitation, CP. Other ROS as below.   Principal Problem: Schizoaffective disorder, depressive type (HCC) Diagnosis:   Patient Active Problem List   Diagnosis Date Noted  . MDD (major depressive disorder), recurrent severe, without psychosis (HCC) [F33.2] 05/14/2016  . Schizoaffective disorder, depressive type (HCC) [F25.1] 05/13/2016   Total Time spent with patient: 20 minutes  Past Psychiatric History: see HPI  Past Medical History:  Past Medical History:  Diagnosis Date  . Anxiety   . Lumbar herniated disc     Past Surgical History:  Procedure Laterality Date  . APPENDECTOMY     Family History:  Family History  Problem Relation Age of Onset  . Cancer Mother   . Diabetes Mother    Family Psychiatric  History: denies Social History:  History  Alcohol Use No     History  Drug Use  . Types: Cocaine, Methamphetamines    Comment: last use 05/09/16    Social History   Social History  . Marital status: Married    Spouse name: N/A  . Number of children: N/A  . Years of education: N/A   Social History Main Topics  . Smoking status: Current Every Day Smoker    Packs/day:  1.00    Types: Cigarettes  . Smokeless tobacco: Former NeurosurgeonUser  . Alcohol use No  . Drug use:     Types: Cocaine, Methamphetamines     Comment: last use 05/09/16  . Sexual activity: Not Asked   Other Topics Concern  . None   Social History Narrative  . None   Additional Social History:                         Sleep: Good  Appetite:  Good  Current Medications: Current Facility-Administered Medications  Medication Dose Route Frequency Provider Last Rate Last Dose  . acetaminophen (TYLENOL) tablet 650 mg  650 mg Oral Q6H PRN Beau FannyJohn C Withrow, FNP      . alum & mag hydroxide-simeth (MAALOX/MYLANTA) 200-200-20 MG/5ML suspension 30 mL  30 mL Oral Q4H PRN Beau FannyJohn C Withrow, FNP      . cloNIDine (CATAPRES) tablet 0.1 mg  0.1 mg Oral QID Neysa Hottereina Saylee Sherrill, MD   Stopped at 05/16/16 [phone]   Followed by  . [START ON 05/17/2016] cloNIDine (CATAPRES) tablet 0.1 mg  0.1 mg Oral BH-qamhs Neysa Hottereina Amadeus Oyama, MD       Followed by  . [START ON 05/19/2016] cloNIDine (CATAPRES) tablet 0.1 mg  0.1 mg Oral QAC breakfast Neysa Hottereina Namrata Dangler, MD      . dicyclomine (BENTYL) tablet 20 mg  20 mg Oral Q6H PRN Barbee Cougheina  Malerie Eakins, MD   20 mg at 05/15/16 1659  . hydrOXYzine (ATARAX/VISTARIL) tablet 25 mg  25 mg Oral Q6H PRN Neysa Hotter, MD   25 mg at 05/15/16 1659  . loperamide (IMODIUM) capsule 2-4 mg  2-4 mg Oral PRN Neysa Hotter, MD      . magnesium hydroxide (MILK OF MAGNESIA) suspension 30 mL  30 mL Oral Daily PRN Beau Fanny, FNP      . methocarbamol (ROBAXIN) tablet 500 mg  500 mg Oral Q8H PRN Neysa Hotter, MD   500 mg at 05/15/16 1659  . OLANZapine (ZYPREXA) tablet 2.5 mg  2.5 mg Oral QHS Neysa Hotter, MD   2.5 mg at 05/15/16 2203  . OLANZapine (ZYPREXA) tablet 2.5 mg  2.5 mg Oral Q8H PRN Neysa Hotter, MD      . ondansetron (ZOFRAN-ODT) disintegrating tablet 4 mg  4 mg Oral Q6H PRN Neysa Hotter, MD      . traZODone (DESYREL) tablet 50 mg  50 mg Oral QHS PRN Neysa Hotter, MD   50 mg at 05/15/16 2205    Lab Results: No  results found for this or any previous visit (from the past 48 hour(s)).  Blood Alcohol level:  Lab Results  Component Value Date   ETH <5 05/12/2016    Metabolic Disorder Labs: No results found for: HGBA1C, MPG No results found for: PROLACTIN No results found for: CHOL, TRIG, HDL, CHOLHDL, VLDL, LDLCALC  Physical Findings: AIMS: Facial and Oral Movements Muscles of Facial Expression: None, normal Lips and Perioral Area: None, normal Jaw: None, normal Tongue: None, normal,Extremity Movements Upper (arms, wrists, hands, fingers): None, normal Lower (legs, knees, ankles, toes): None, normal, Trunk Movements Neck, shoulders, hips: None, normal, Overall Severity Severity of abnormal movements (highest score from questions above): None, normal Incapacitation due to abnormal movements: None, normal Patient's awareness of abnormal movements (rate only patient's report): No Awareness, Dental Status Current problems with teeth and/or dentures?: No Does patient usually wear dentures?: No  CIWA:    COWS:  COWS Total Score: 9  Musculoskeletal: Strength & Muscle Tone: within normal limits Gait & Station: normal Patient leans: N/A  Psychiatric Specialty Exam: Physical Exam  Constitutional: He appears well-developed and well-nourished.  Skin: Skin is warm and dry.  Psychiatric:  No tremors, no ridigity    Review of Systems  Constitutional: Positive for diaphoresis and malaise/fatigue. Negative for chills.  Eyes: Negative for blurred vision.  Gastrointestinal: Positive for diarrhea and nausea. Negative for heartburn.  Musculoskeletal: Positive for myalgias.  Neurological: Negative for dizziness.  Psychiatric/Behavioral: Negative for depression, hallucinations and suicidal ideas. The patient does not have insomnia.   All other systems reviewed and are negative.   Blood pressure (!) 92/57, pulse (!) 57, temperature 97.7 F (36.5 C), temperature source Oral, resp. rate 16, height 6'  (1.829 m), weight 153 lb (69.4 kg), SpO2 100 %.Body mass index is 20.75 kg/m.  General Appearance: Fairly Groomed  Eye Contact:  Fair  Speech:  Normal Rate  Volume:  Normal  Mood:  tired  Affect:  Blunt  Thought Process:  Coherent and Goal Directed  Orientation:  Full (Time, Place, and Person)  Thought Content:  Logical No paranoia. Perceptions: denies AH since this morning. Denies VH  Suicidal Thoughts:  No  Homicidal Thoughts:  No  Memory:  Negative  Judgement:  Fair  Insight:  Present  Psychomotor Activity:  Decreased  Concentration:  Concentration: Fair and Attention Span: Fair  Recall:  Good  Fund of  Knowledge:  Fair  Language:  Negative  Akathisia:  No  Handed:  Right  AIMS (if indicated):     Assets:  Communication Skills Desire for Improvement  ADL's:  Intact  Cognition:  WNL  Sleep:  Number of Hours: 6.75   Assessment 45 year old man with schizoaffective disorder, presented to ED complaining of AH and paranoia in the setting of non adherence to medication, cocaine and methamphetamine use.   # Schizoaffective disorderd  There is an improvement in his paranoia and AH since starting olanzapine and abstinent from drug use. He continues to be calm and demonstrate linear thought process. He reports preceding symptoms of depression prior to coming to the hospital. Will consider adding mood stabilizer in the future; will continue current regimen at this time given his marginal hypotension.   # Opioid withdrawal  Some improvement in his physical symptoms of opiate withdrawal. Continue withdrawal protocol.   # Cocaine use disorder # Methamphetamine use disorder He is at motivated stage. Will continue motivational interview.   Plans - Continue olanzapine 2.5 mg qhs/ monitor for orthostatic hypotension - Continue olanzapine 2.5 mg q8hprn for agitation - Continue Trazodone 50 mg qhsprn for insomnia - Continue opiate withdrawal protocol - Admit for crisis management and  stabilization. - Medication management to reduce current symptoms to base line and improve the patient's overall level of functioning. - Monitor for the adverse effect of the medications and anger outbursts - Continue 15 minutes observation for safety concerns - Encouraged to participate in milieu therapy and group therapy counseling sessions and also work with coping skills -  Develop treatment plan to decrease risk of relapse upon discharge and to reduce the need for readmission. -  Psycho-social education regarding relapse prevention and self care. - Health care follow up as needed for medical problems.  Treatment Plan Summary: Daily contact with patient to assess and evaluate symptoms and progress in treatment  Neysa Hotter, MD 05/16/2016, 11:16 AM

## 2016-05-17 DIAGNOSIS — F251 Schizoaffective disorder, depressive type: Principal | ICD-10-CM

## 2016-05-17 MED ORDER — OLANZAPINE 5 MG PO TABS
5.0000 mg | ORAL_TABLET | Freq: Every day | ORAL | Status: DC
  Administered 2016-05-17: 5 mg via ORAL
  Filled 2016-05-17: qty 1
  Filled 2016-05-17: qty 2
  Filled 2016-05-17 (×2): qty 1

## 2016-05-17 NOTE — Progress Notes (Signed)
Patient ID: Brian West, male   DOB: 1971-06-18, 45 y.o.   MRN: [id number] [facility] MD Progress Note  05/17/2016 12:30 PM CADON RACZKA  MRN:  [id number] Subjective:   Patient seen, chart reviewed and case discussed with nursing staff.  -per reports pt is isolating in his room  Pt seen in his room. He was lying in his bed in the dark.  states he is depressed and anxious. Pt did not sleep well last night and energy is low. He is nervous to be around others and prefers to stay alone. States appetite is increased but energy is low. He is endorsing paranoia and AH of voices putting him down. Denies VH, SI/HI. He feels med doses need to be increased and denies SE.    Principal Problem: Schizoaffective disorder, depressive type (HCC) Diagnosis:   Patient Active Problem List   Diagnosis Date Noted  . MDD (major depressive disorder), recurrent severe, without psychosis (HCC) [F33.2] 05/14/2016  . Schizoaffective disorder, depressive type (HCC) [F25.1] 05/13/2016   Total Time spent with patient: 20 minutes  Past Psychiatric History: see HPI  Past Medical History:  Past Medical History:  Diagnosis Date  . Anxiety   . Lumbar herniated disc     Past Surgical History:  Procedure Laterality Date  . APPENDECTOMY     Family History:  Family History  Problem Relation Age of Onset  . Cancer Mother   . Diabetes Mother    Family Psychiatric  History: denies Social History:  History  Alcohol Use No     History  Drug Use  . Types: Cocaine, Methamphetamines    Comment: last use 05/09/16    Social History   Social History  . Marital status: Married    Spouse name: N/A  . Number of children: N/A  . Years of education: N/A   Social History Main Topics  . Smoking status: Current Every Day Smoker    Packs/day: 1.00    Types: Cigarettes  . Smokeless tobacco: Former Neurosurgeon  . Alcohol use No  . Drug use:     Types: Cocaine, Methamphetamines     Comment: last use 05/09/16  .  Sexual activity: Not Asked   Other Topics Concern  . None   Social History Narrative  . None   Additional Social History:                         Sleep: Good  Appetite:  Good  Current Medications: Current Facility-Administered Medications  Medication Dose Route Frequency Provider Last Rate Last Dose  . acetaminophen (TYLENOL) tablet 650 mg  650 mg Oral Q6H PRN Beau Fanny, FNP      . alum & mag hydroxide-simeth (MAALOX/MYLANTA) 200-200-20 MG/5ML suspension 30 mL  30 mL Oral Q4H PRN Beau Fanny, FNP      . cloNIDine (CATAPRES) tablet 0.1 mg  0.1 mg Oral BH-qamhs Neysa Hotter, MD       Followed by  . [START ON 05/19/2016] cloNIDine (CATAPRES) tablet 0.1 mg  0.1 mg Oral QAC breakfast Neysa Hotter, MD      . dicyclomine (BENTYL) tablet 20 mg  20 mg Oral Q6H PRN Neysa Hotter, MD   20 mg at 05/15/16 1659  . hydrOXYzine (ATARAX/VISTARIL) tablet 25 mg  25 mg Oral Q6H PRN Neysa Hotter, MD   25 mg at 05/16/16 1706  . loperamide (IMODIUM) capsule 2-4 mg  2-4 mg Oral PRN Neysa Hotter, MD      .  magnesium hydroxide (MILK OF MAGNESIA) suspension 30 mL  30 mL Oral Daily PRN Beau Fanny, FNP      . methocarbamol (ROBAXIN) tablet 500 mg  500 mg Oral Q8H PRN Neysa Hotter, MD   500 mg at 05/15/16 1659  . OLANZapine (ZYPREXA) tablet 2.5 mg  2.5 mg Oral QHS Neysa Hotter, MD   2.5 mg at 05/16/16 2114  . OLANZapine (ZYPREXA) tablet 2.5 mg  2.5 mg Oral Q8H PRN Neysa Hotter, MD      . ondansetron (ZOFRAN-ODT) disintegrating tablet 4 mg  4 mg Oral Q6H PRN Neysa Hotter, MD      . traZODone (DESYREL) tablet 50 mg  50 mg Oral QHS PRN Neysa Hotter, MD   50 mg at 05/16/16 2115    Lab Results: No results found for this or any previous visit (from the past 48 hour(s)).  Blood Alcohol level:  Lab Results  Component Value Date   ETH <5 05/12/2016    Metabolic Disorder Labs: No results found for: HGBA1C, MPG No results found for: PROLACTIN No results found for: CHOL, TRIG, HDL, CHOLHDL,  VLDL, LDLCALC  Physical Findings: AIMS: Facial and Oral Movements Muscles of Facial Expression: None, normal Lips and Perioral Area: None, normal Jaw: None, normal Tongue: None, normal,Extremity Movements Upper (arms, wrists, hands, fingers): None, normal Lower (legs, knees, ankles, toes): None, normal, Trunk Movements Neck, shoulders, hips: None, normal, Overall Severity Severity of abnormal movements (highest score from questions above): None, normal Incapacitation due to abnormal movements: None, normal Patient's awareness of abnormal movements (rate only patient's report): No Awareness, Dental Status Current problems with teeth and/or dentures?: No Does patient usually wear dentures?: No  CIWA:    COWS:  COWS Total Score: 3  Musculoskeletal: Strength & Muscle Tone: within normal limits Gait & Station: normal Patient leans: N/A  Psychiatric Specialty Exam: Physical Exam  Constitutional: He appears well-developed and well-nourished.  Skin: Skin is warm and dry.  Psychiatric:  No tremors, no ridigity    Review of Systems  Constitutional: Positive for malaise/fatigue. Negative for chills and diaphoresis.  Eyes: Negative for blurred vision.  Gastrointestinal: Negative for diarrhea, heartburn and nausea.  Musculoskeletal: Positive for myalgias.  Neurological: Negative for dizziness.  Psychiatric/Behavioral: Positive for depression. Negative for suicidal ideas. The patient is nervous/anxious and has insomnia.   All other systems reviewed and are negative.   Blood pressure 92/64, pulse 70, temperature 97.7 F (36.5 C), temperature source Oral, resp. rate 16, height 6' (1.829 m), weight 69.4 kg (153 lb), SpO2 100 %.Body mass index is 20.75 kg/m.  General Appearance: Disheveled  Eye Contact:  Fair  Speech:  Normal Rate  Volume:  Normal  Mood:  Anxious and Depressed  Affect:  Blunt  Thought Process:  Coherent and Goal Directed  Orientation:  Full (Time, Place, and Person)   Thought Content:  Hallucinations: Auditory and Paranoid Ideation   Suicidal Thoughts:  No  Homicidal Thoughts:  No  Memory:  Negative  Judgement:  Fair  Insight:  Present  Psychomotor Activity:  Decreased  Concentration:  Concentration: Fair and Attention Span: Fair  Recall:  Good  Fund of Knowledge:  Fair  Language:  Negative  Akathisia:  No  Handed:  Right  AIMS (if indicated):     Assets:  Communication Skills Desire for Improvement  ADL's:  Intact  Cognition:  WNL  Sleep:  Number of Hours: 6.25   Assessment 45 year old man with schizoaffective disorder, presented to ED complaining of  AH and paranoia in the setting of non adherence to medication, cocaine and methamphetamine use.   # Schizoaffective disorderd  There is an improvement in his paranoia and AH since starting olanzapine and abstinent from drug use. He continues to be calm and demonstrate linear thought process. He reports preceding symptoms of depression prior to coming to the hospital. Will consider adding mood stabilizer in the future; will continue current regimen at this time given his marginal hypotension.   # Opioid withdrawal  Some improvement in his physical symptoms of opiate withdrawal. Continue withdrawal protocol.   # Cocaine use disorder # Methamphetamine use disorder He is at motivated stage. Will continue motivational interview.   Plans -increase olanzapin to 5 mg qhs/ monitor for orthostatic hypotension. Will place on fall precautions.  - Continue olanzapine 2.5 mg q8hprn for agitation - Continue Trazodone 50 mg qhs prn for insomnia - Continue opiate withdrawal protocol - Admit for crisis management and stabilization. - Medication management to reduce current symptoms to base line and improve the patient's overall level of functioning. - Monitor for the adverse effect of the medications and anger outbursts - Continue 15 minutes observation for safety concerns - Encouraged to participate in  milieu therapy and group therapy counseling sessions and also work with coping skills -  Develop treatment plan to decrease risk of relapse upon discharge and to reduce the need for readmission. -  Psycho-social education regarding relapse prevention and self care. - Health care follow up as needed for medical problems.  Treatment Plan Summary: Daily contact with patient to assess and evaluate symptoms and progress in treatment  Oletta DarterSalina Krystina Strieter, MD 05/17/2016, 12:30 PM

## 2016-05-17 NOTE — Progress Notes (Signed)
D: Brian West has been pacing the halls since the start of shift. He is endorsing AH. Voices telling him he is "no good, worthless and a burden to others". He mood is labile. He is tearful at times during our conversation. Rates Anxiety as high. He did not attend group as he has been pacing. He is not combative or disruptive at this time. He is minimally interactive with his peers. Endorsing increased paranoia. Denies SI/HI/VH at this time. Contracts for safety.  A: Encouragement and support given. Q15 minute room checks for patient safety. Medications administered as prescribed.  R: Continue to monitor for patient safety and medication effectiveness.

## 2016-05-17 NOTE — Progress Notes (Signed)
Nile Dear. Rommel has been up and visible in milieu this evening, did attend and participate in evening group activity. Chris had minimal interaction with staff but did receive bedtime medications without incident. Thayer OhmChris spoke briefly about how he is not feeling as paranoid as he did and realizes the need for sobriety.  A. Support and encouragement provided. R. Safety maintained, will continue to monitor.

## 2016-05-17 NOTE — [facility] Group Notes (Signed)
[facility] Group Notes:  (Nursing/MHT/Case Management/Adjunct)  Date:  05/17/2016  Time:  10:31 AM  Type of Therapy:  Nurse Education  Almira Barenny G Miriam Liles 05/17/2016, 10:31 AM

## 2016-05-17 NOTE — Progress Notes (Signed)
Did not attend group 

## 2016-05-17 NOTE — [facility] Group Notes (Signed)
[facility] Group Notes: (Clinical Social Work)   05/17/2016      Type of Therapy:  Group Therapy   Participation Level:  Did Not Attend despite MHT prompting   Ambrose MantleMareida Grossman-Orr, LCSW 05/17/2016, 4:22 PM

## 2016-05-17 NOTE — Progress Notes (Signed)
D: Patient denies SI/HI/AVH. Patient has a flat affect and depressed mood, he answers questions but forwards little to staff.  Pt secludes to his room and did not attend morning group.  Pt. Does go the cafeteria for meals and only complaint was of gastric upset following a meal.  A: Patient given emotional support from RN. Patient encouraged to come to staff with concerns and/or questions. Patient's medication routine continued. Patient's orders and plan of care reviewed.   R: Patient remains appropriate and cooperative. Will continue to monitor patient q15 minutes for safety.

## 2016-05-18 MED ORDER — QUETIAPINE FUMARATE 100 MG PO TABS
100.0000 mg | ORAL_TABLET | Freq: Every day | ORAL | Status: DC
  Administered 2016-05-18: 100 mg via ORAL
  Filled 2016-05-18 (×3): qty 1

## 2016-05-18 NOTE — Progress Notes (Signed)
Patient ID: Brian West, male   DOB: 02-08-1971, 45 y.o.   MRN: [id number] Patient ID: Brian FilterChristopher R West, male   DOB: 02-08-1971, [age] y.o.   MRN: [id number] [facility] Note  05/18/2016 11:50 AM Brian West  MRN:  [id number] Subjective:   Patient seen, chart reviewed and case discussed with nursing staff.  -per reports pt is anxious, labile and having increased AH  Pt seen in his room. He was lying in his bed in the dark.  states he is depressed and anxious. Pt did not sleep well again last night and energy is low. He is nervous to be around others and prefers to stay alone. States  energy is low. He is endorsing paranoia and AH of voices putting him down. He is having SI without plan or intent. States he wants to die but does not have the nerve to do anything.  Denies VH. Denies HI. He feels med doses need to be increased and denies SE.    He denies any withdrawal symptoms.   Principal Problem: Schizoaffective disorder, depressive type (HCC) Diagnosis:   Patient Active Problem List   Diagnosis Date Noted  . MDD (major depressive disorder), recurrent severe, without psychosis (HCC) [F33.2] 05/14/2016  . Schizoaffective disorder, depressive type (HCC) [F25.1] 05/13/2016   Total Time spent with patient: 20 minutes  Past Psychiatric History: see HPI  Past Medical History:  Past Medical History:  Diagnosis Date  . Anxiety   . Lumbar herniated disc     Past Surgical History:  Procedure Laterality Date  . APPENDECTOMY     Family History:  Family History  Problem Relation Age of Onset  . Cancer Mother   . Diabetes Mother    Family Psychiatric  History: denies Social History:  History  Alcohol Use No     History  Drug Use  . Types: Cocaine, Methamphetamines    Comment: last use 05/09/16    Social History   Social History  . Marital status: Married    Spouse name: N/A  . Number of children: N/A  . Years of education: N/A   Social History Main  Topics  . Smoking status: Current Every Day Smoker    Packs/day: 1.00    Types: Cigarettes  . Smokeless tobacco: Former NeurosurgeonUser  . Alcohol use No  . Drug use:     Types: Cocaine, Methamphetamines     Comment: last use 05/09/16  . Sexual activity: Not Asked   Other Topics Concern  . None   Social History Narrative  . None   Additional Social History:                         Sleep: Poor  Appetite:  Good  Current Medications: Current Facility-Administered Medications  Medication Dose Route Frequency Provider Last Rate Last Dose  . acetaminophen (TYLENOL) tablet 650 mg  650 mg Oral Q6H PRN Beau FannyJohn C Withrow, FNP   650 mg at 05/17/16 1351  . alum & mag hydroxide-simeth (MAALOX/MYLANTA) 200-200-20 MG/5ML suspension 30 mL  30 mL Oral Q4H PRN Beau FannyJohn C Withrow, FNP      . cloNIDine (CATAPRES) tablet 0.1 mg  0.1 mg Oral BH-qamhs Neysa Hottereina Hisada, MD   0.1 mg at 05/18/16 0834   Followed by  . [START ON 05/19/2016] cloNIDine (CATAPRES) tablet 0.1 mg  0.1 mg Oral QAC breakfast Neysa Hottereina Hisada, MD      . dicyclomine (BENTYL) tablet 20 mg  20 mg  Oral Q6H PRN Neysa Hotter, MD   20 mg at 05/17/16 1351  . hydrOXYzine (ATARAX/VISTARIL) tablet 25 mg  25 mg Oral Q6H PRN Neysa Hotter, MD   25 mg at 05/18/16 0836  . loperamide (IMODIUM) capsule 2-4 mg  2-4 mg Oral PRN Neysa Hotter, MD      . magnesium hydroxide (MILK OF MAGNESIA) suspension 30 mL  30 mL Oral Daily PRN Beau Fanny, FNP      . methocarbamol (ROBAXIN) tablet 500 mg  500 mg Oral Q8H PRN Neysa Hotter, MD   500 mg at 05/15/16 1659  . OLANZapine (ZYPREXA) tablet 2.5 mg  2.5 mg Oral Q8H PRN Neysa Hotter, MD      . OLANZapine (ZYPREXA) tablet 5 mg  5 mg Oral QHS Oletta Darter, MD   5 mg at 05/17/16 2117  . ondansetron (ZOFRAN-ODT) disintegrating tablet 4 mg  4 mg Oral Q6H PRN Neysa Hotter, MD      . traZODone (DESYREL) tablet 50 mg  50 mg Oral QHS PRN Neysa Hotter, MD   50 mg at 05/17/16 2117    Lab Results: No results found for this or any  previous visit (from the past 48 hour(s)).  Blood Alcohol level:  Lab Results  Component Value Date   ETH <5 05/12/2016    Metabolic Disorder Labs: No results found for: HGBA1C, MPG No results found for: PROLACTIN No results found for: CHOL, TRIG, HDL, CHOLHDL, VLDL, LDLCALC  Physical Findings: AIMS: Facial and Oral Movements Muscles of Facial Expression: None, normal Lips and Perioral Area: None, normal Jaw: None, normal Tongue: None, normal,Extremity Movements Upper (arms, wrists, hands, fingers): None, normal Lower (legs, knees, ankles, toes): None, normal, Trunk Movements Neck, shoulders, hips: None, normal, Overall Severity Severity of abnormal movements (highest score from questions above): None, normal Incapacitation due to abnormal movements: None, normal Patient's awareness of abnormal movements (rate only patient's report): No Awareness, Dental Status Current problems with teeth and/or dentures?: No Does patient usually wear dentures?: No  CIWA:  CIWA-Ar Total: 14 COWS:  COWS Total Score: 7  Musculoskeletal: Strength & Muscle Tone: within normal limits Gait & Station: normal Patient leans: N/A  Psychiatric Specialty Exam: Physical Exam  Constitutional: He appears well-developed and well-nourished.  Skin: Skin is warm and dry.  Psychiatric:  No tremors, no ridigity    Review of Systems  Constitutional: Positive for malaise/fatigue. Negative for chills and diaphoresis.  Eyes: Negative for blurred vision.  Gastrointestinal: Positive for abdominal pain. Negative for diarrhea, heartburn and nausea.  Musculoskeletal: Positive for joint pain and myalgias.  Neurological: Negative for dizziness.  Psychiatric/Behavioral: Positive for depression, hallucinations and suicidal ideas. The patient is nervous/anxious and has insomnia.   All other systems reviewed and are negative.   Blood pressure 100/61, pulse (!) 58, temperature 97.8 F (36.6 C), temperature source  Oral, resp. rate 16, height 6' (1.829 m), weight 69.4 kg (153 lb), SpO2 99 %.Body mass index is 20.75 kg/m.  General Appearance: Disheveled  Eye Contact:  Fair  Speech:  Normal Rate  Volume:  Normal  Mood:  Anxious and Depressed  Affect:  Blunt  Thought Process:  Coherent and Goal Directed  Orientation:  Full (Time, Place, and Person)  Thought Content:  Hallucinations: Auditory and Paranoid Ideation   Suicidal Thoughts:  Yes.  without intent/plan  Homicidal Thoughts:  No  Memory:  Negative  Judgement:  Fair  Insight:  Present  Psychomotor Activity:  Decreased  Concentration:  Concentration: Fair and Attention  Span: Fair  Recall:  [employer] of Knowledge:  Fair  Language:  Negative  Akathisia:  No  Handed:  Right  AIMS (if indicated):     Assets:  Communication Skills Desire for Improvement  ADL's:  Intact  Cognition:  WNL  Sleep:  Number of Hours: 4.75   Assessment 45 year old man with schizoaffective disorder, presented to ED complaining of AH and paranoia in the setting of non adherence to medication, cocaine and methamphetamine use.   # Schizoaffective disorderd  There is an improvement in his paranoia and AH since starting olanzapine and abstinent from drug use. He continues to be calm and demonstrate linear thought process. He reports preceding symptoms of depression prior to coming to the hospital. Will consider adding mood stabilizer in the future; will continue current regimen at this time given his marginal hypotension.   # Opioid withdrawal  Some improvement in his physical symptoms of opiate withdrawal. Continue withdrawal protocol.   # Cocaine use disorder # Methamphetamine use disorder He is at motivated stage. Will continue motivational interview.   Plans -d/c olanzapin  -start trial of Seroquel 100 mg qhs for depression and insomnia.  Will monitor for orthostatic hypotension. Will place on fall precautions.  - Continue olanzapine 2.5 mg q8hprn for  agitation - Continue Trazodone 50 mg qhs prn for insomnia - Continue opiate withdrawal protocol - Admit for crisis management and stabilization. - Medication management to reduce current symptoms to base line and improve the patient's overall level of functioning. - Monitor for the adverse effect of the medications and anger outbursts - Continue 15 minutes observation for safety concerns - Encouraged to participate in milieu therapy and group therapy counseling sessions and also work with coping skills -  Develop treatment plan to decrease risk of relapse upon discharge and to reduce the need for readmission. -  Psycho-social education regarding relapse prevention and self care. - Health care follow up as needed for medical problems.  Treatment Plan Summary: Daily contact with patient to assess and evaluate symptoms and progress in treatment  Oletta Darter, MD 05/18/2016, 11:50 AM

## 2016-05-18 NOTE — [facility] Group Notes (Addendum)
[facility] Group Notes:  (Nursing/MHT/Case Management/Adjunct)  Date:  05/18/2016  Time:  11:14 AM  Type of Therapy:  Nurse Education  Did not attend.  Almira Barenny G Nizhoni Parlow 05/18/2016, 11:14 AM

## 2016-05-18 NOTE — Progress Notes (Signed)
Data. Patient denies SI/HI. Patient reports that even with medications, patient has not been able to sleep the last two nights. Patient has large bags under his eyes and he looks extremely tired. Patient also reports that his "voices", are getting worse over the  last day. Patient's BP was low this AM. Patient interacting well with staff and other patients. Isolated in his room in bed, most of shift. Action. MD notified of low BP and minimal sleep. Large jug with Gatorade given to patient to keep with him and drink often.Emotional support and encouragement offered. Education provided on medication, indications and side effect. Q 15 minute checks done for safety. Response. Patient's BP improved at 5pm reading. Safety on the unit maintained through 15 minute checks.  Medications taken as prescribed. Did not attend groups. Remained calm and appropriate through out shift.

## 2016-05-18 NOTE — [facility] Group Notes (Signed)
[facility] Group Notes: (Clinical Social Work)   05/18/2016      Type of Therapy:  Group Therapy   Participation Level:  Did Not Attend despite MHT prompting   Lydiann Bonifas Grossman-Orr, LCSW 05/18/2016, 12:57 PM     

## 2016-05-18 NOTE — Progress Notes (Signed)
Adult Psychoeducational Group Note  Date:  05/18/2016 Time:  9:39 PM  Group Topic/Focus:  Wrap-Up Group:   The focus of this group is to help patients review their daily goal of treatment and discuss progress on daily workbooks.   Participation Level:  Active  Participation Quality:  Appropriate and Attentive  Affect:  Appropriate  Cognitive:  Appropriate  Insight: Appropriate and Good  Engagement in Group:  Engaged  Modes of Intervention:  Discussion  Additional Comments:  Pt attended group and stated his goal was to make it one group today. Pt attended wrap up group so he was able to achieve this goal Pt was encouraged to make his needs known to staff.  Caswell CorwinOwen, Ronnald Shedden C 05/18/2016, 9:39 PM

## 2016-05-19 DIAGNOSIS — F1590 Other stimulant use, unspecified, uncomplicated: Secondary | ICD-10-CM

## 2016-05-19 DIAGNOSIS — R45851 Suicidal ideations: Secondary | ICD-10-CM

## 2016-05-19 DIAGNOSIS — Z8659 Personal history of other mental and behavioral disorders: Secondary | ICD-10-CM

## 2016-05-19 DIAGNOSIS — F1120 Opioid dependence, uncomplicated: Secondary | ICD-10-CM | POA: Diagnosis present

## 2016-05-19 DIAGNOSIS — F1410 Cocaine abuse, uncomplicated: Secondary | ICD-10-CM

## 2016-05-19 MED ORDER — LAMOTRIGINE 25 MG PO TABS
25.0000 mg | ORAL_TABLET | Freq: Every day | ORAL | Status: DC
  Administered 2016-05-19 – 2016-05-21 (×3): 25 mg via ORAL
  Filled 2016-05-19 (×4): qty 1

## 2016-05-19 MED ORDER — OLANZAPINE 10 MG IM SOLR: 5.0000 mg | Freq: Three times a day (TID) | INTRAMUSCULAR | Status: DC | PRN

## 2016-05-19 MED ORDER — BUSPIRONE HCL 5 MG PO TABS
5.0000 mg | ORAL_TABLET | Freq: Three times a day (TID) | ORAL | Status: DC
  Administered 2016-05-19 – 2016-05-21 (×6): 5 mg via ORAL
  Filled 2016-05-19 (×9): qty 1

## 2016-05-19 MED ORDER — OLANZAPINE 5 MG PO TBDP
5.0000 mg | ORAL_TABLET | Freq: Three times a day (TID) | ORAL | Status: DC | PRN
  Administered 2016-05-20: 5 mg via ORAL
  Filled 2016-05-19: qty 1

## 2016-05-19 MED ORDER — QUETIAPINE FUMARATE 100 MG PO TABS
150.0000 mg | ORAL_TABLET | Freq: Every day | ORAL | Status: DC
  Administered 2016-05-19 – 2016-05-20 (×2): 150 mg via ORAL
  Filled 2016-05-19 (×3): qty 3

## 2016-05-19 NOTE — Progress Notes (Signed)
Patient ID: Brian West, male   DOB: 11/22/1970, 45 y.o.   MRN: [id number] Patient ID: Brian West, male   DOB: 11/22/1970, 45 y.o.   MR[facility]: [id number] [facility]  05/19/2016 2:18 PM Brian West  MRN:  [id number] Subjective:  Pt states " I still feel sad, there is a lot going on.'  Objective;45 year old caucasian man with schizophrenia, married , [place] , presented to ED complaining of AH and paranoia in the setting of non adherence to medication.  Patient seen, chart reviewed and case discussed with nursing staff.  Pt today is seen as tearful , depressed and anxious. Pt with hx of stimulant, suboxone abuse - is motivated to get help. Reports a hx of borderline personality do as well as multiple charges , has been in prison several times. Per staff - pt is tolerating medications well, will continue treatment.     Principal Problem: Schizoaffective disorder, depressive type (HCC) Diagnosis:   Patient Active Problem List   Diagnosis Date Noted  . H/O borderline personality disorder [Z86.59] 05/19/2016  . Opioid use disorder, moderate, dependence (HCC) [F11.20] 05/19/2016  . Stimulant use disorder (HCC) [F15.90] 05/19/2016  . Cocaine use disorder, mild, abuse [F14.10] 05/19/2016  . MDD (major depressive disorder), recurrent severe, without psychosis (HCC) [F33.2] 05/14/2016  . Schizoaffective disorder, depressive type (HCC) [F25.1] 05/13/2016   Total Time spent with patient: 25 minutes  Past Psychiatric History: see HPI  Past Medical History:  Past Medical History:  Diagnosis Date  . Anxiety   . Lumbar herniated disc     Past Surgical History:  Procedure Laterality Date  . APPENDECTOMY     Family History:  Family History  Problem Relation Age of Onset  . Cancer Mother   . Diabetes Mother    Family Psychiatric  History: denies Social History:  History  Alcohol Use No     History  Drug Use  . Types:  Cocaine, Methamphetamines    Comment: last use 05/09/16    Social History   Social History  . Marital status: Married    Spouse name: N/A  . Number of children: N/A  . Years of education: N/A   Social History Main Topics  . Smoking status: Current Every Day Smoker    Packs/day: 1.00    Types: Cigarettes  . Smokeless tobacco: Former NeurosurgeonUser  . Alcohol use No  . Drug use:     Types: Cocaine, Methamphetamines     Comment: last use 05/09/16  . Sexual activity: Not Asked   Other Topics Concern  . None   Social History Narrative  . None   Additional Social History:                         Sleep: Poor  Appetite:  Good  Current Medications: Current Facility-Administered Medications  Medication Dose Route Frequency Provider Last Rate Last Dose  . acetaminophen (TYLENOL) tablet 650 mg  650 mg Oral Q6H PRN Beau FannyJohn C Withrow, FNP   650 mg at 05/19/16 0846  . alum & mag hydroxide-simeth (MAALOX/MYLANTA) 200-200-20 MG/5ML suspension 30 mL  30 mL Oral Q4H PRN Beau FannyJohn C Withrow, FNP      . busPIRone (BUSPAR) tablet 5 mg  5 mg Oral TID Jomarie LongsSaramma Haylen Shelnutt, MD   5 mg at 05/19/16 1158  . cloNIDine (CATAPRES) tablet 0.1 mg  0.1 mg Oral QAC breakfast Neysa Hottereina Hisada, MD  0.1 mg at 05/19/16 0844  . dicyclomine (BENTYL) tablet 20 mg  20 mg Oral Q6H PRN Neysa Hotter, MD   20 mg at 05/17/16 1351  . hydrOXYzine (ATARAX/VISTARIL) tablet 25 mg  25 mg Oral Q6H PRN Neysa Hotter, MD   25 mg at 05/19/16 1158  . lamoTRIgine (LAMICTAL) tablet 25 mg  25 mg Oral Daily Damani Rando, MD   25 mg at 05/19/16 1158  . loperamide (IMODIUM) capsule 2-4 mg  2-4 mg Oral PRN Neysa Hotter, MD      . magnesium hydroxide (MILK OF MAGNESIA) suspension 30 mL  30 mL Oral Daily PRN Beau Fanny, FNP      . methocarbamol (ROBAXIN) tablet 500 mg  500 mg Oral Q8H PRN Neysa Hotter, MD   500 mg at 05/18/16 1326  . OLANZapine zydis (ZYPREXA) disintegrating tablet 5 mg  5 mg Oral TID PRN Jomarie Longs, MD       Or  . OLANZapine  (ZYPREXA) injection 5 mg  5 mg Intramuscular TID PRN Jomarie Longs, MD      . ondansetron (ZOFRAN-ODT) disintegrating tablet 4 mg  4 mg Oral Q6H PRN Neysa Hotter, MD      . QUEtiapine (SEROQUEL) tablet 150 mg  150 mg Oral QHS Carla Whilden, MD      . traZODone (DESYREL) tablet 50 mg  50 mg Oral QHS PRN Neysa Hotter, MD   50 mg at 05/17/16 2117    Lab Results: No results found for this or any previous visit (from the past 48 hour(s)).  Blood Alcohol level:  Lab Results  Component Value Date   ETH <5 05/12/2016    Metabolic Disorder Labs: No results found for: HGBA1C, MPG No results found for: PROLACTIN No results found for: CHOL, TRIG, HDL, CHOLHDL, VLDL, LDLCALC  Physical Findings: AIMS: Facial and Oral Movements Muscles of Facial Expression: None, normal Lips and Perioral Area: None, normal Jaw: None, normal Tongue: None, normal,Extremity Movements Upper (arms, wrists, hands, fingers): None, normal Lower (legs, knees, ankles, toes): None, normal, Trunk Movements Neck, shoulders, hips: None, normal, Overall Severity Severity of abnormal movements (highest score from questions above): None, normal Incapacitation due to abnormal movements: None, normal Patient's awareness of abnormal movements (rate only patient's report): No Awareness, Dental Status Current problems with teeth and/or dentures?: No Does patient usually wear dentures?: No  CIWA:  CIWA-Ar Total: 1 COWS:  COWS Total Score: 1  Musculoskeletal: Strength & Muscle Tone: within normal limits Gait & Station: normal Patient leans: N/A  Psychiatric Specialty Exam: Physical Exam  Constitutional: He appears well-developed and well-nourished.  Skin: Skin is warm and dry.  Psychiatric:  No tremors, no ridigity    Review of Systems  Constitutional: Positive for malaise/fatigue. Negative for chills and diaphoresis.  Eyes: Negative for blurred vision.  Gastrointestinal: Negative for diarrhea, heartburn and nausea.   Musculoskeletal: Positive for joint pain and myalgias.  Neurological: Negative for dizziness.  Psychiatric/Behavioral: Positive for depression, hallucinations, substance abuse and suicidal ideas. The patient is nervous/anxious and has insomnia.   All other systems reviewed and are negative.   Blood pressure (!) 108/53, pulse 72, temperature 98.6 F (37 C), temperature source Oral, resp. rate 18, height 6' (1.829 m), weight 69.4 kg (153 lb), SpO2 99 %.Body mass index is 20.75 kg/m.  General Appearance: Disheveled  Eye Contact:  Fair  Speech:  Normal Rate  Volume:  Normal  Mood:  Anxious and Depressed  Affect:  Tearful  Thought Process:  Coherent, Goal  Directed and Descriptions of Associations: Intact  Orientation:  Full (Time, Place, and Person)  Thought Content:  Hallucinations: Auditory and Paranoid Ideation   Suicidal Thoughts:  Yes.  without intent/plan  Homicidal Thoughts:  No  Memory:  Negative  Judgement:  Fair  Insight:  Present  Psychomotor Activity:  Decreased  Concentration:  Concentration: Fair and Attention Span: Fair  Recall:  Good  Fund of Knowledge:  Fair  Language:  Negative  Akathisia:  No  Handed:  Right  AIMS (if indicated):     Assets:  Communication Skills Desire for Improvement  ADL's:  Intact  Cognition:  WNL  Sleep:  Number of Hours: 4.5   Assessment 45 year old man with schizoaffective disorder, presented to ED complaining of AH and paranoia in the setting of non adherence to medication, cocaine and methamphetamine use. Patient continues to be tearful, labile , is motivated to get help with his substance abuse.Will continue treatment.   Plans Will increase Seroquel to 150 mg po qhs for depression and insomnia.  Will add Lamictal 25 mg po daily for mood sx. Will monitor for orthostatic hypotension. Will place on fall precautions.  -Will continue medications as per agitation protocol. - Continue Trazodone 50 mg qhs prn for insomnia - Continue  opiate withdrawal protocol - Medication management to reduce current symptoms to base line and improve the patient's overall level of functioning. - Monitor for the adverse effect of the medications and anger outbursts - Continue 15 minutes observation for safety concerns - Encouraged to participate in milieu therapy and group therapy counseling sessions and also work with coping skills -  Develop treatment plan to decrease risk of relapse upon discharge and to reduce the need for readmission. -  Psycho-social education regarding relapse prevention and self care. - Health care follow up as needed for medical problems.  Treatment Plan Summary: Daily contact with patient to assess and evaluate symptoms and progress in treatment  Elaya Droege, MD 05/19/2016, 2:18 PM

## 2016-05-19 NOTE — Progress Notes (Addendum)
D:  Patient denied SI and HI, contracts for safety.  Denied A/V hallucinations. A:  Medications administered per MD order.  Emotional support and encouragement given patient. R:  Safety maintained with 15 minute checks.  Patient's self inventory sheet, patient sleeps good, sleep medication is helpful.  Good appetite, normal energy level, good concentration.  Rated depression 7, hopeless and anxiety 10.  Deneid withdrawals.  Denied SI.  Denied physical problems.  Goal is to feel better mentally.  Plans to discharge home.  Has been hearing some voices later this afternoon.

## 2016-05-19 NOTE — Progress Notes (Signed)
EKG COMPLETED AND PUT ON MD DESK FOR REVIEW.

## 2016-05-19 NOTE — Progress Notes (Signed)
Recreation Therapy Notes  Date: 05/19/16 Time: 1000 Location: 500 Hall Dayroom  Group Topic: Wellness  Goal Area(s) Addresses:  Patient will define components of whole wellness. Patient will verbalize benefit of whole wellness.  Intervention:  Beach Ball, Chairs  Activity: Seated Soccer.  Patients were to sit in chairs arranged in a half circle.  Patients were to kick the ball back and forth to each other and try to score a goal on the LRT.   Education: Wellness, Discharge Planning.   Education Outcome: Needs additional education.   Clinical Observations/Feedback: Pt did not attend group.    Kendell Sagraves, LRT/CTRS  

## 2016-05-19 NOTE — Progress Notes (Signed)
Adult Psychoeducational Group Note  Date:  05/19/2016 Time:  8:25 PM  Group Topic/Focus:  Wrap-Up Group:   The focus of this group is to help patients review their daily goal of treatment and discuss progress on daily workbooks.   Participation Level:  Active  Participation Quality:  Appropriate  Affect:  Appropriate  Cognitive:  Appropriate  Insight: Appropriate  Engagement in Group:  Engaged  Modes of Intervention:  Discussion  Additional Comments:  The patient expressed he attended all groups.The patient also said that his wife made his day. Octavio Mannshigpen, Sindhu Nguyen Lee 05/19/2016, 8:25 PM

## 2016-05-19 NOTE — [facility] Group Notes (Signed)
[facility] LCSW Group Therapy  05/19/2016 1:15 pm  Type of Therapy: Process Group Therapy  Participation Level:  Active  Participation Quality:  Appropriate  Affect:  Flat  Cognitive:  Oriented  Insight:  Improving  Engagement in Group:  Limited  Engagement in Therapy:  Limited  Modes of Intervention:  Activity, Clarification, Education, Problem-solving and Support  Summary of Progress/Problems: Today's group addressed the issue of overcoming obstacles.  Patients were asked to identify their biggest obstacle post d/c that stands in the way of their on-going success, and then problem solve as to how to manage this. Invited.  Chose to not attend.  Brian West, Brian West 05/19/2016   3:52 PM

## 2016-05-19 NOTE — Progress Notes (Signed)
D. Pt had been up and visible in milieu this evening, did attend and participate in evening group activity, spoke about on-going feelings of paranoia as well as insomnia and is concerned that he has not been sleeping well the past couple of nights. Pt did receive bedtime medications without incident and did come to nursing station around midnight and spoke about how his mind was racing and still unable to get to sleep and accepted offer to go to quiet room to sleep. A. Support given, quiet room provided to help with sleep issues. R. Safety maintained, will continue to monitor.

## 2016-05-19 NOTE — Plan of Care (Signed)
Problem: Education: Goal: Ability to make informed decisions regarding treatment will improve Outcome: Progressing Nurse discussed depression/anxiety/coping skills with patient.    

## 2016-05-20 LAB — T4, FREE: FREE T4: 0.8 ng/dL (ref 0.61–1.12)

## 2016-05-20 LAB — LIPID PANEL
CHOL/HDL RATIO: 4.7 ratio
CHOLESTEROL: 192 mg/dL (ref 0–200)
HDL: 41 mg/dL (ref >40)
LDL Cholesterol: 108 mg/dL — ABNORMAL HIGH (ref 0–99)
Triglycerides: 213 mg/dL — ABNORMAL HIGH (ref <150)
VLDL: 43 mg/dL — ABNORMAL HIGH (ref 0–40)

## 2016-05-20 LAB — TSH: TSH: 4.632 u[IU]/mL — AB (ref 0.350–4.500)

## 2016-05-20 NOTE — Progress Notes (Signed)
Recreation Therapy Notes  Date: 05/20/16 Time: 1000 Location: 500 Hall Dayroom  Group Topic: Leisure Education  Goal Area(s) Addresses:  Patient will identify positive leisure activities.  Patient will identify one positive benefit of participation in leisure activities.   Behavioral Response: Observed  Intervention: sponge balls, 20 cups  Activity: Wacky Bowling:  LRT divided the group into two teams.  LRT set up the two sets of cups like bowling pins.  Each pt on the teams took turns knocking down the cups.  Each pt got two chances per turn to knock down the cups.  Education:  Leisure Education, Building control surveyorDischarge Planning  Education Outcome: Acknowledges education/In group clarification offered/Needs additional education  Clinical Observations/Feedback:  Pt did not participate but watched as his peers played.   Caroll RancherMarjette Telford Archambeau, LRT/CTRS    Caroll RancherLindsay, Jordi Kamm A 05/20/2016 12:26 PM

## 2016-05-20 NOTE — [facility] Group Notes (Signed)
[facility] LCSW Group Therapy  05/20/2016 , 4:06 PM   Type of Therapy:  Group Therapy  Participation Level:  Active  Participation Quality:  Attentive  Affect:  Appropriate  Cognitive:  Alert  Insight:  Improving  Engagement in Therapy:  Engaged  Modes of Intervention:  Discussion, Exploration and Socialization  Summary of Progress/Problems: Today's group focused on the term Diagnosis.  Participants were asked to define the term, and then pronounce whether it is a negative, positive or neutral term.  Stayed the entire time.  Appeared distracted.  Looking at the floor with head in hands.  When asked directly stated that he had not been paying attention.  Admitted to feeling down.  Others gave him encouragement, thanked him that he  has been welcoming and engaging to others.  He responded appropriately-thanking them and expressing his appreciation.  Daryel Geraldorth, Maryem Shuffler B 05/20/2016 , 4:06 PM

## 2016-05-20 NOTE — Progress Notes (Addendum)
Patient ID: Ave FilterChristopher R Santana, male   DOB: Oct 17, 1970, 45 y.o.   MRN: [id number] Patient ID: Ave FilterChristopher R Sherley, male   DOB: Oct 17, 1970, 45 y.o.   MRN: [id number] [facility]  05/20/2016 12:38 PM Ave FilterChristopher R Kabat  MRN:  [id number] Subjective:  Pt states " I feel ok.'   Objective;45 year old caucasian man with schizophrenia, married , [place] , presented to ED complaining of AH and paranoia in the setting of non adherence to medication.  Patient seen, chart reviewed and case discussed with nursing staff.  Pt today is seen as less depressed , not as tearful as yesterday . Pt reports he is tolerating the medications well. Denies any new concerns. He is motivated to get out patient help for his substance abuse issues. He continues to ruminate about his situational stressors as well as relationship with son. He also has paranoia when it comes to his wife , even though she is very supportive. Per staff - pt is tolerating medications well, will continue treatment.     Principal Problem: Schizoaffective disorder, depressive type (HCC) Diagnosis:   Patient Active Problem List   Diagnosis Date Noted  . H/O borderline personality disorder [Z86.59] 05/19/2016  . Opioid use disorder, moderate, dependence (HCC) [F11.20] 05/19/2016  . Stimulant use disorder (HCC) [F15.90] 05/19/2016  . Cocaine use disorder, mild, abuse [F14.10] 05/19/2016  . MDD (major depressive disorder), recurrent severe, without psychosis (HCC) [F33.2] 05/14/2016  . Schizoaffective disorder, depressive type (HCC) [F25.1] 05/13/2016   Total Time spent with patient: 25 minutes  Past Psychiatric History: see HPI  Past Medical History:  Past Medical History:  Diagnosis Date  . Anxiety   . Lumbar herniated disc     Past Surgical History:  Procedure Laterality Date  . APPENDECTOMY     Family History:  Family History  Problem Relation Age of Onset  . Cancer Mother   .  Diabetes Mother    Family Psychiatric  History: denies Social History:  History  Alcohol Use No     History  Drug Use  . Types: Cocaine, Methamphetamines    Comment: last use 05/09/16    Social History   Social History  . Marital status: Married    Spouse name: N/A  . Number of children: N/A  . Years of education: N/A   Social History Main Topics  . Smoking status: Current Every Day Smoker    Packs/day: 1.00    Types: Cigarettes  . Smokeless tobacco: Former NeurosurgeonUser  . Alcohol use No  . Drug use:     Types: Cocaine, Methamphetamines     Comment: last use 05/09/16  . Sexual activity: Not Asked   Other Topics Concern  . None   Social History Narrative  . None   Additional Social History:                         Sleep: Fair  Appetite:  Good  Current Medications: Current Facility-Administered Medications  Medication Dose Route Frequency Provider Last Rate Last Dose  . acetaminophen (TYLENOL) tablet 650 mg  650 mg Oral Q6H PRN Beau FannyJohn C Withrow, FNP   650 mg at 05/19/16 0846  . alum & mag hydroxide-simeth (MAALOX/MYLANTA) 200-200-20 MG/5ML suspension 30 mL  30 mL Oral Q4H PRN Beau FannyJohn C Withrow, FNP      . busPIRone (BUSPAR) tablet 5 mg  5 mg Oral TID Jomarie LongsSaramma Mertice Uffelman, MD   5  mg at 05/20/16 0825  . dicyclomine (BENTYL) tablet 20 mg  20 mg Oral Q6H PRN Neysa Hotter, MD   20 mg at 05/17/16 1351  . hydrOXYzine (ATARAX/VISTARIL) tablet 25 mg  25 mg Oral Q6H PRN Neysa Hotter, MD   25 mg at 05/19/16 1158  . lamoTRIgine (LAMICTAL) tablet 25 mg  25 mg Oral Daily Jomarie Longs, MD   25 mg at 05/20/16 0825  . loperamide (IMODIUM) capsule 2-4 mg  2-4 mg Oral PRN Neysa Hotter, MD      . magnesium hydroxide (MILK OF MAGNESIA) suspension 30 mL  30 mL Oral Daily PRN Beau Fanny, FNP      . methocarbamol (ROBAXIN) tablet 500 mg  500 mg Oral Q8H PRN Neysa Hotter, MD   500 mg at 05/18/16 1326  . OLANZapine zydis (ZYPREXA) disintegrating tablet 5 mg  5 mg Oral TID PRN Jomarie Longs, MD        Or  . OLANZapine (ZYPREXA) injection 5 mg  5 mg Intramuscular TID PRN Jomarie Longs, MD      . ondansetron (ZOFRAN-ODT) disintegrating tablet 4 mg  4 mg Oral Q6H PRN Neysa Hotter, MD      . QUEtiapine (SEROQUEL) tablet 150 mg  150 mg Oral QHS Jomarie Longs, MD   150 mg at 05/19/16 2135  . traZODone (DESYREL) tablet 50 mg  50 mg Oral QHS PRN Neysa Hotter, MD   50 mg at 05/17/16 2117    Lab Results:  Results for orders placed or performed during the hospital encounter of 05/14/16 (from the past 48 hour(s))  Lipid panel     Status: Abnormal   Collection Time: 05/20/16  6:19 AM  Result Value Ref Range   Cholesterol 192 0 - 200 mg/dL   Triglycerides 409 (H) <150 mg/dL   HDL 41 >81 mg/dL   Total CHOL/HDL Ratio 4.7 RATIO   VLDL 43 (H) 0 - 40 mg/dL   LDL Cholesterol 191 (H) 0 - 99 mg/dL    Comment:        Total Cholesterol/HDL:CHD Risk Coronary Heart Disease Risk Table                     Men   Women  1/2 Average Risk   3.4   3.3  Average Risk       5.0   4.4  2 X Average Risk   9.6   7.1  3 X Average Risk  23.4   11.0        Use the calculated Patient Ratio above and the CHD Risk Table to determine the patient's CHD Risk.        ATP III CLASSIFICATION (LDL):  <100     mg/dL   Optimal  478-295  mg/dL   Near or Above                    Optimal  130-159  mg/dL   Borderline  621-308  mg/dL   High  >657     mg/dL   Very High Performed at [facility]   TSH     Status: Abnormal   Collection Time: 05/20/16  6:19 AM  Result Value Ref Range   TSH 4.632 (H) 0.350 - 4.500 uIU/mL    Comment: Performed at [facility]    Blood Alcohol level:  Lab Results  Component Value Date   [facility] <5 05/12/2016    Metabolic Disorder Labs: No results  found for: HGBA1C, MPG No results found for: PROLACTIN Lab Results  Component Value Date   CHOL 192 05/20/2016   TRIG 213 (H) 05/20/2016   HDL 41 05/20/2016   CHOLHDL 4.7 05/20/2016   VLDL 43 (H) 05/20/2016    LDLCALC 108 (H) 05/20/2016    Physical Findings: AIMS: Facial and Oral Movements Muscles of Facial Expression: None, normal Lips and Perioral Area: None, normal Jaw: None, normal Tongue: None, normal,Extremity Movements Upper (arms, wrists, hands, fingers): None, normal Lower (legs, knees, ankles, toes): None, normal, Trunk Movements Neck, shoulders, hips: None, normal, Overall Severity Severity of abnormal movements (highest score from questions above): None, normal Incapacitation due to abnormal movements: None, normal Patient's awareness of abnormal movements (rate only patient's report): No Awareness, Dental Status Current problems with teeth and/or dentures?: No Does patient usually wear dentures?: No  CIWA:  CIWA-Ar Total: 1 COWS:  COWS Total Score: 2  Musculoskeletal: Strength & Muscle Tone: within normal limits Gait & Station: normal Patient leans: N/A  Psychiatric Specialty Exam: Physical Exam  Nursing note and vitals reviewed. Constitutional: He appears well-developed and well-nourished.  Skin: Skin is warm and dry.  Psychiatric:  No tremors, no ridigity    Review of Systems  Constitutional: Positive for malaise/fatigue. Negative for chills and diaphoresis.  Eyes: Negative for blurred vision.  Gastrointestinal: Negative for diarrhea, heartburn and nausea.  Neurological: Negative for dizziness.  Psychiatric/Behavioral: Positive for depression and substance abuse. The patient is nervous/anxious.   All other systems reviewed and are negative.   Blood pressure 102/65, pulse 68, temperature 97.6 F (36.4 C), temperature source Oral, resp. rate 20, height 6' (1.829 m), weight 69.4 kg (153 lb), SpO2 99 %.Body mass index is 20.75 kg/m.  General Appearance: Disheveled  Eye Contact:  Fair  Speech:  Normal Rate  Volume:  Normal  Mood:  Anxious and Depressed  Affect:  Tearful on and off  Thought Process:  Coherent, Goal Directed and Descriptions of Associations: Intact   Orientation:  Full (Time, Place, and Person)  Thought Content:  Hallucinations: Auditory and Paranoid Ideation improving  Suicidal Thoughts:  No  Homicidal Thoughts:  No  Memory:  Immediate;   Fair Recent;   Fair Remote;   Fair  Judgement:  Fair  Insight:  Present  Psychomotor Activity:  Decreased  Concentration:  Concentration: Fair and Attention Span: Fair  Recall:  Good  Fund of Knowledge:  Fair  Language:  Negative  Akathisia:  No  Handed:  Right  AIMS (if indicated):     Assets:  Communication Skills Desire for Improvement  ADL's:  Intact  Cognition:  WNL  Sleep:  Number of Hours: 5.5   Assessment 45 year old man with schizoaffective disorder, presented to ED complaining of AH and paranoia in the setting of non adherence to medication, cocaine and methamphetamine use. Patient continues to make progress  , is motivated to get help with his substance abuse.Will continue treatment.   Plans Will continue Seroquel 150 mg po qhs for depression and insomnia.  Will continue Lamictal 25 mg po daily for mood sx. Will monitor for orthostatic hypotension. Will place on fall precautions.  -Will continue medications as per agitation protocol. - Continue Trazodone 50 mg po qhs prn for insomnia - Continue opiate withdrawal protocol TSH is high - will get t3, t4. Lipid panel abnormal - recommend diet control. - Medication management to reduce current symptoms to base line and improve the patient's overall level of functioning. - - Continue 15  minutes observation for safety concerns - Encouraged to participate in milieu therapy and group therapy counseling sessions and also work with coping skills -  Develop treatment plan to decrease risk of relapse upon discharge and to reduce the need for readmission. -  Psycho-social education regarding relapse prevention and self care. - Health care follow up as needed for medical problems.  Treatment Plan Summary: Daily contact with patient to  assess and evaluate symptoms and progress in treatment  Omar Orrego, MD 05/20/2016, 12:38 PM

## 2016-05-20 NOTE — Progress Notes (Signed)
Brian West. Brian West is up at this time, reports that he is having difficulty with sleep again tonight. He did speak earlier about how he slept better last night but spoke about the difficulties he has staying asleep. He did endorse some paranoid ideation earlier in the shift and did report some auditory hallucinations but reported that they were better than what they had been. Brian West does appear to be decompensating slightly as the evening has worn on. He has appeared restless and has appeared more irritable but did speak about how he was going to try and lay down to get some sleep as he feels that he needs to. A. Support and encouragement provided. R. Safety maintained, will continue to monitor.

## 2016-05-20 NOTE — Progress Notes (Signed)
DAR NOTE: Patient presents with anxious affect and depressed mood.  Reports decrease in auditory hallucination.  Patient observed pacing on the unit.  Mild tremors noted.   Maintained on routine safety checks.  Medications given as prescribed.  Support and encouragement offered as needed.  Attended group and participated.  Minimal interaction with staff.  Patient remained withdrawn to self.    Zyprexa 5 mg given for complain of severe anxiety with good effect.

## 2016-05-20 NOTE — [facility] Suicide Risk Assessment (Signed)
[facility] INPATIENT:  Family/Significant Other Suicide Prevention Education  Suicide Prevention Education:  Education Completed; Corky CraftsCrystal Dubois, wife, [phone]  has been identified by the patient as the family member/significant other with whom the patient will be residing, and identified as the person(s) who will aid the patient in the event of a mental health crisis (suicidal ideations/suicide attempt).  With written consent from the patient, the family member/significant other has been provided the following suicide prevention education, prior to the and/or following the discharge of the patient.  The suicide prevention education provided includes the following:  Suicide risk factors  Suicide prevention and interventions  National Suicide Hotline telephone number  [facility] assessment telephone number  [facility] Emergency Assistance 911  [facility] and/or Residential Mobile Crisis Unit telephone number  Request made of family/significant other to:  Remove weapons (e.g., guns, rifles, knives), all items previously/currently identified as safety concern.    Remove drugs/medications (over-the-counter, prescriptions, illicit drugs), all items previously/currently identified as a safety concern.  The family member/significant other verbalizes understanding of the suicide prevention education information provided.  The family member/significant other agrees to remove the items of safety concern listed above.  Baldo DaubRodney B [facility] 05/20/2016, 4:34 PM

## 2016-05-20 NOTE — Tx Team (Signed)
Interdisciplinary Treatment Plan Update (Adult)  Date:  05/20/2016   Time Reviewed:  8:34 AM   Progress in Treatment: Attending groups: Yes. Participating in groups:  Yes. Taking medication as prescribed:  Yes. Tolerating medication:  Yes. Family/Significant other contact made:  Yes Patient understands diagnosis:  Yes  As evidenced by seeking help with psychosis, depression Discussing patient identified problems/goals with staff:  Yes, see initial care plan. Medical problems stabilized or resolved:  Yes. Denies suicidal/homicidal ideation: Yes. Issues/concerns per patient self-inventory:  No. Other:  New problem(s) identified:  Discharge Plan or Barriers: see below  Reason for Continuation of Hospitalization: Depression Hallucinations Medication stabilization Other; describe paranoia  Comments:  Estimated length of stay: Likley d/c tomorrow  New goal(s):  Review of initial/current patient goals per problem list:   Review of initial/current patient goals per problem list:  1. Goal(s): Patient will participate in aftercare plan   Met: Yes   Target date: 3-5 days post admission date   As evidenced by: Patient will participate within aftercare plan AEB aftercare provider and housing plan at discharge being identified. 05/15/16:  Return home, follow up [facility]   2. Goal (s): Patient will exhibit decreased depressive symptoms and suicidal ideations.   Met: No   Target date: 3-5 days post admission date   As evidenced by: Patient will utilize self rating of depression at 3 or below and demonstrate decreased signs of depression or be deemed stable for discharge by MD. 05/15/16:  Rates depression a 7 today     4. Goal(s): Patient will demonstrate decreased signs of withdrawal due to substance abuse   Met: No   Target date: 3-5 days post admission date   As evidenced by: Patient will produce a CIWA/COWS score of 0, have stable vitals signs, and no  symptoms of withdrawal 05/15/16:  On clonodine detox protocol for withdrawal from suboxone, which he has been buying off the street.    5. Goal(s): Patient will demonstrate decreased signs of psychosis  * Met: No  * Target date: 3-5 days post admission date  * As evidenced by: Patient will demonstrate decreased frequency of AVH or return to baseline function 05/15/16:  C/O paranoia, VH, AH      Attendees: Patient:  05/20/2016 8:34 AM   Family:   05/20/2016 8:34 AM   Physician:  Ursula Alert, MD 05/20/2016 8:34 AM   Nursing: Hedy Jacob, RN 05/20/2016 8:34 AM   CSW:    Roque Lias, LCSW   05/20/2016 8:34 AM   Other:  05/20/2016 8:34 AM   Other:   05/20/2016 8:34 AM   Other:  Lars Pinks, Nurse CM 05/20/2016 8:34 AM   Other:   05/20/2016 8:34 AM   Other:  Norberto Sorenson, [place]  05/20/2016 8:34 AM   Other:  05/20/2016 8:34 AM   Other:  05/20/2016 8:34 AM   Other:  05/20/2016 8:34 AM   Other:  05/20/2016 8:34 AM   Other:  05/20/2016 8:34 AM   Other:   05/20/2016 8:34 AM    Scribe for Treatment Team:   Trish Mage, 05/20/2016 8:34 AM

## 2016-05-20 NOTE — Tx Team (Signed)
Interdisciplinary Treatment Plan Update (Adult)  Date:  05/20/2016   Time Reviewed:  4:10 PM   Progress in Treatment: Attending groups: Yes. Participating in groups:  Yes. Taking medication as prescribed:  Yes. Tolerating medication:  Yes. Family/Significant other contact made:  No Patient understands diagnosis:  Yes  As evidenced by seeking help with psychosis, depression Discussing patient identified problems/goals with staff:  Yes, see initial care plan. Medical problems stabilized or resolved:  Yes. Denies suicidal/homicidal ideation: Yes. Issues/concerns per patient self-inventory:  No. Other:  New problem(s) identified:  Discharge Plan or Barriers: see below  Reason for Continuation of Hospitalization: Depression Hallucinations Medication stabilization Other; describe paranoia  Comments:  Estimated length of stay: Likely d/c tomorrow  New goal(s):  Review of initial/current patient goals per problem list:   Review of initial/current patient goals per problem list:  1. Goal(s): Patient will participate in aftercare plan   Met: Yes   Target date: 3-5 days post admission date   As evidenced by: Patient will participate within aftercare plan AEB aftercare provider and housing plan at discharge being identified. 05/15/16:  Return home, follow up [facility]   2. Goal (s): Patient will exhibit decreased depressive symptoms and suicidal ideations.   Met: Yes   Target date: 3-5 days post admission date   As evidenced by: Patient will utilize self rating of depression at 3 or below and demonstrate decreased signs of depression or be deemed stable for discharge by MD. 05/15/16:  Rates depression a 7 today 05/20/16:  Rates depression a 4 today, states it is "mageable"     4. Goal(s): Patient will demonstrate decreased signs of withdrawal due to substance abuse   Met: Yes   Target date: 3-5 days post admission date   As evidenced by: Patient will  produce a CIWA/COWS score of 0, have stable vitals signs, and no symptoms of withdrawal 05/15/16:  On clonodine detox protocol for withdrawal from suboxone, which he has been buying off the street. 05/20/16: COWS score of 1, vitals are stable    5. Goal(s): Patient will demonstrate decreased signs of psychosis  * Met: Yes  * Target date: 3-5 days post admission date  * As evidenced by: Patient will demonstrate decreased frequency of AVH or return to baseline function 05/15/16:  C/O paranoia, VH, AH 05/20/16:  Denies symptoms today      Attendees: Patient:  05/20/2016 4:10 PM   Family:   05/20/2016 4:10 PM   Physician:  Ursula Alert, MD 05/20/2016 4:10 PM   Nursing: Hedy Jacob, RN 05/20/2016 4:10 PM   CSW:    Roque Lias, LCSW   05/20/2016 4:10 PM   Other:  05/20/2016 4:10 PM   Other:   05/20/2016 4:10 PM   Other:  Lars Pinks, Nurse CM 05/20/2016 4:10 PM   Other:   05/20/2016 4:10 PM   Other:  Norberto Sorenson, [place]  05/20/2016 4:10 PM   Other:  05/20/2016 4:10 PM   Other:  05/20/2016 4:10 PM   Other:  05/20/2016 4:10 PM   Other:  05/20/2016 4:10 PM   Other:  05/20/2016 4:10 PM   Other:   05/20/2016 4:10 PM    Scribe for Treatment Team:   Trish Mage, 05/20/2016 4:10 PM

## 2016-05-21 LAB — HEMOGLOBIN A1C
HEMOGLOBIN A1C: 5.5 % (ref 4.8–5.6)
MEAN PLASMA GLUCOSE: 111 mg/dL

## 2016-05-21 MED ORDER — QUETIAPINE FUMARATE 50 MG PO TABS: 150.0000 mg | ORAL_TABLET | Freq: Every day | ORAL | 0 refills | Status: DC

## 2016-05-21 MED ORDER — BUSPIRONE HCL 5 MG PO TABS: 5.0000 mg | ORAL_TABLET | Freq: Three times a day (TID) | ORAL | 0 refills | Status: AC

## 2016-05-21 MED ORDER — TRAZODONE HCL 50 MG PO TABS: 50.0000 mg | ORAL_TABLET | Freq: Every evening | ORAL | 0 refills | Status: DC | PRN

## 2016-05-21 MED ORDER — LAMOTRIGINE 25 MG PO TABS: 25.0000 mg | ORAL_TABLET | Freq: Every day | ORAL | 0 refills | Status: DC

## 2016-05-21 NOTE — Discharge Summary (Signed)
Physician Discharge Summary Note  Patient:  Brian West is an 45 y.o., male MRN:  [id number] DOB:  [birth year] Patient phone:  [phone] (home)  Patient address:   [address] [place] [address],  Total Time spent with patient: 30 minutes  Date of Admission:  05/14/2016 Date of Discharge: 05/21/2016  Reason for Admission:  Auditory hallucinations  Principal Problem: Schizoaffective disorder, depressive type [facility]) Discharge Diagnoses: Patient Active Problem List   Diagnosis Date Noted  . H/O borderline personality disorder [Z86.59] 05/19/2016  . Opioid use disorder, moderate, dependence (HCC) [F11.20] 05/19/2016  . Stimulant use disorder (HCC) [F15.90] 05/19/2016  . Cocaine use disorder, mild, abuse [F14.10] 05/19/2016  . MDD (major depressive disorder), recurrent severe, without psychosis (HCC) [F33.2] 05/14/2016  . Schizoaffective disorder, depressive type (HCC) [F25.1] 05/13/2016    Past Psychiatric History: see HPI  Past Medical History:  Past Medical History:  Diagnosis Date  . Anxiety   . Lumbar herniated disc     Past Surgical History:  Procedure Laterality Date  . APPENDECTOMY     Family History:  Family History  Problem Relation Age of Onset  . Cancer Mother   . Diabetes Mother    Family Psychiatric  History:  See HPI Social History:  History  Alcohol Use No     History  Drug Use  . Types: Cocaine, Methamphetamines    Comment: last use 05/09/16    Social History   Social History  . Marital status: Married    Spouse name: N/A  . Number of children: N/A  . Years of education: N/A   Social History Main Topics  . Smoking status: Current Every Day Smoker    Packs/day: 1.00    Types: Cigarettes  . Smokeless tobacco: Former Neurosurgeon  . Alcohol use No  . Drug use:     Types: Cocaine, Methamphetamines     Comment: last use 05/09/16  . Sexual activity: Not Asked   Other Topics Concern  . None   Social History Narrative  . None    Hospital  Course:   Brian West, 45 year old man with schizophrenia, presented to ED complaining of AH and paranoia in the setting of non adherence to medication.    Brian West was admitted for Schizoaffective disorder, depressive type (HCC) and crisis management.  He was treated with Lamictal 25 mg mood stabilization, Buspar 5 mg anxiety, Seroquel 150 mg mood stabilization and Trazodone 50 mg insomnia.  Medical problems were identified and treated as needed.  Home medications were restarted as appropriate.  Improvement was monitored by observation and Brian West daily report of symptom reduction.  Emotional and mental status was monitored by daily self inventory reports completed by Brian West and clinical staff.  Patient reported continued improvement, denied any new concerns.  Patient had been compliant on medications and denied side effects.  Support and encouragement was provided.    Patient encouraged to attend groups to help with recognizing triggers of emotional crises and de-stabilizations.  Patient encouraged to attend group to help identify the positive things in life that would help in dealing with feelings of loss, depression and unhealthy or abusive tendencies.         Brian West was evaluated by the treatment team for stability and plans for continued recovery upon discharge.  He was offered further treatment options upon discharge including Residential, Intensive Outpatient and Outpatient treatment.  He will follow up with  for medication management and  counseling.  Encouraged patient to maintain satisfactory support network and home environment.  Advised to adhere to medication compliance and outpatient treatment follow up.  Prescriptions provided.       Brian FilterChristopher R West motivation was an integral factor for scheduling further treatment.  Employment, transportation, bed availability, health status, family support, and any pending legal issues  were also considered during his hospital stay.  Upon completion of this admission the patient was both mentally and medically stable for discharge denying suicidal/homicidal ideation, auditory/visual/tactile hallucinations, delusional thoughts and paranoia.      Physical Findings: AIMS: Facial and Oral Movements Muscles of Facial Expression: None, normal Lips and Perioral Area: None, normal Jaw: None, normal Tongue: None, normal,Extremity Movements Upper (arms, wrists, hands, fingers): None, normal Lower (legs, knees, ankles, toes): None, normal, Trunk Movements Neck, shoulders, hips: None, normal, Overall Severity Severity of abnormal movements (highest score from questions above): None, normal Incapacitation due to abnormal movements: None, normal Patient's awareness of abnormal movements (rate only patient's report): No Awareness, Dental Status Current problems with teeth and/or dentures?: No Does patient usually wear dentures?: No  CIWA:  CIWA-Ar Total: 1 COWS:  COWS Total Score: 5  Musculoskeletal: Strength & Muscle Tone: within normal limits Gait & Station: normal Patient leans: N/A  Psychiatric Specialty Exam:  SEE MD SRA Physical Exam  Nursing note and vitals reviewed.   Review of Systems  Constitutional: Negative.   HENT: Negative.   Eyes: Negative.   Respiratory: Negative.   Cardiovascular: Negative.   Gastrointestinal: Negative.   Genitourinary: Negative.   Musculoskeletal: Negative.   Skin: Negative.   Neurological: Negative.   Endo/Heme/Allergies: Negative.   Psychiatric/Behavioral: Negative.   All other systems reviewed and are negative.   Blood pressure 97/64, pulse 67, temperature 97.8 F (36.6 C), resp. rate 16, height 6' (1.829 m), weight 69.4 kg (153 lb), SpO2 99 %.Body mass index is 20.75 kg/m.   Have you used any form of tobacco in the last 30 days? (Cigarettes, Smokeless Tobacco, Cigars, and/or Pipes): Yes  Has this patient used any form of  tobacco in the last 30 days? (Cigarettes, Smokeless Tobacco, Cigars, and/or Pipes) Yes, No  Blood Alcohol level:  Lab Results  Component Value Date   ETH <5 05/12/2016    Metabolic Disorder Labs:  Lab Results  Component Value Date   HGBA1C 5.5 05/20/2016   MPG 111 05/20/2016   No results found for: PROLACTIN Lab Results  Component Value Date   CHOL 192 05/20/2016   TRIG 213 (H) 05/20/2016   HDL 41 05/20/2016   CHOLHDL 4.7 05/20/2016   VLDL 43 (H) 05/20/2016   LDLCALC 108 (H) 05/20/2016    See Psychiatric Specialty Exam and Suicide Risk Assessment completed by Attending Physician prior to discharge.  Discharge destination:  Home  Is patient on multiple antipsychotic therapies at discharge:  No   Has Patient had three or more failed trials of antipsychotic monotherapy by history:  No  Recommended Plan for Multiple Antipsychotic Therapies: NA     Medication List    TAKE these medications     Indication  busPIRone 5 MG tablet Commonly known as:  BUSPAR Take 1 tablet (5 mg total) by mouth 3 (three) times daily.  Indication:  Generalized Anxiety Disorder   lamoTRIgine 25 MG tablet Commonly known as:  LAMICTAL Take 1 tablet (25 mg total) by mouth daily.  Indication:  mood stabilization   QUEtiapine 50 MG tablet Commonly known as:  SEROQUEL Take 3 tablets (150  mg total) by mouth at bedtime.  Indication:  mood stabilization   traZODone 50 MG tablet Commonly known as:  DESYREL Take 1 tablet (50 mg total) by mouth at bedtime as needed for sleep.  Indication:  Trouble Sleeping      Follow-up Information    [facility].   Why:  Monday at 9:45 with Phineas SemenAshton.  Then on 8/30, Wednesday at 10:40 with the Dr. Benay Pillowontact information: [address]  [place] [phone]] [phone]          Follow-up recommendations:  Activity:  as tol Diet:  as tol  Comments:  1.  Take all your medications as prescribed.   2.  Report any adverse side effects to  outpatient provider. 3.  Patient instructed to not use alcohol or illegal drugs while on prescription medicines. 4.  In the event of worsening symptoms, instructed patient to call 911, the crisis hotline or go to nearest emergency room for evaluation of symptoms.  Signed: Lindwood QuaSheila May Genie Mirabal, NP [facility] 05/21/2016, 11:48 AM

## 2016-05-21 NOTE — Progress Notes (Signed)
  [facility] :  Will you be returning to the same living situation after discharge:  Yes,  home At discharge, do you have transportation home?: Yes,  wife Do you have the ability to pay for your medications: Yes,  mental health  Release of information consent forms completed and in the chart;  Patient's signature needed at discharge.  Patient to Follow up at: Follow-up Information    [facility] on 06/02/2016.   Why:  Monday at 9:45 with Phineas SemenAshton.  Then on 8/30, Wednesday at 10:40 with the Dr. Benay Pillowontact information: [address]  [place] [phone]          Next level of care provider has [facility] to [facility] Link:no  Safety Planning and Suicide Prevention discussed: Yes,  yes  Have you used any form of tobacco in the last 30 days? (Cigarettes, Smokeless Tobacco, Cigars, and/or Pipes): Yes  Has patient been referred to the Quitline?: Patient refused referral  Patient has been referred for addiction treatment: Yes  Brian West 05/21/2016, 10:02 AM

## 2016-05-21 NOTE — [facility] Suicide Risk Assessment (Signed)
[facility]   Principal Problem: Schizoaffective disorder, depressive type [facility](HCC) Discharge Diagnoses:  Patient Active Problem List   Diagnosis Date Noted  . H/O borderline personality disorder [Z86.59] 05/19/2016  . Opioid use disorder, moderate, dependence (HCC) [F11.20] 05/19/2016  . Stimulant use disorder (HCC) [F15.90] 05/19/2016  . Cocaine use disorder, mild, abuse [F14.10] 05/19/2016  . MDD (major depressive disorder), recurrent severe, without psychosis (HCC) [F33.2] 05/14/2016  . Schizoaffective disorder, depressive type (HCC) [F25.1] 05/13/2016    Total Time spent with patient: 30 minutes  Musculoskeletal: Strength & Muscle Tone: within normal limits Gait & Station: normal Patient leans: N/A  Psychiatric Specialty Exam: Review of Systems  Psychiatric/Behavioral: Positive for substance abuse. Negative for depression. The patient is nervous/anxious (improved).   All other systems reviewed and are negative.   Blood pressure 97/64, pulse 67, temperature 97.8 F (36.6 C), resp. rate 16, height 6' (1.829 m), weight 69.4 kg (153 lb), SpO2 99 %.Body mass index is 20.75 kg/m.  General Appearance: Casual  Eye Contact::  Fair  Speech:  Clear and Coherent409  Volume:  Normal  Mood:  Anxious improved  Affect:  Appropriate  Thought Process:  Goal Directed and Descriptions of Associations: Intact  Orientation:  Full (Time, Place, and Person)  Thought Content:  Logical  Suicidal Thoughts:  No  Homicidal Thoughts:  No  Memory:  Immediate;   Fair Recent;   Fair Remote;   Fair  Judgement:  Fair  Insight:  Fair  Psychomotor Activity:  Normal  Concentration:  Fair  Recall:  [employer]Fair  Fund of Knowledge:Fair  Language: Fair  Akathisia:  No  Handed:  Right  AIMS (if indicated):   0  Assets:  Desire for Improvement Physical Health Social Support  Sleep:  Number of Hours: 5.5  Cognition: WNL  ADL's:  Intact   Mental Status Per Nursing Assessment::    On Admission:  Suicidal ideation indicated by patient  Demographic Factors:  Male, Caucasian and Unemployed  Loss Factors: Decrease in vocational status, Legal issues and Financial problems/change in socioeconomic status  Historical Factors: Impulsivity  Risk Reduction Factors:   Positive social support  Continued Clinical Symptoms:  Alcohol/Substance Abuse/Dependencies Previous Psychiatric Diagnoses and Treatments  Cognitive Features That Contribute To Risk:  None    Suicide Risk:  Minimal: No identifiable suicidal ideation.  Patients presenting with no risk factors but with morbid ruminations; may be classified as minimal risk based on the severity of the depressive symptoms  Follow-up Information    [facility]   Why:  Monday at 9:45 with Phineas SemenAshton.  Then on 8/30, Wednesday at 10:40 with the Dr. Benay Pillowontact information: [address]  [place] [phone]          Plan Of Care/Follow-up recommendations:  Activity:  no restrictions Diet:  regular Tests:  as needed Other:  follow up with aftercare  Jaiyah Beining, MD 05/21/2016, 9:30 AM

## 2016-05-21 NOTE — Progress Notes (Signed)
   D: When asked about his day pt stated, "better as time goes on. I'm still having problems with my sleep."  Pt stated, " I think they gonna try to get rid of me tomorrow."  Pt informed the writer that he was offered I/P, but stated "I just can't leave stuff at home unattended." Stated, his wife is not able to handle all the the business.  A:  Support and encouragement was offered. 15 min checks continued for safety.  R: Pt remains safe.

## 2016-05-21 NOTE — Progress Notes (Signed)
Recreation Therapy Notes  Date: 05/21/16 Time: 1000 Location: 500 Hall Dayroom  Group Topic: Self-Esteem  Goal Area(s) Addresses:  Patient will identify positive ways to increase self-esteem. Patient will verbalize benefit of increased self-esteem.  Behavioral Response: Attentive  Intervention: Worksheets with West blank face, markers, colored pencils, crayons  Activity: How I See Me.  LRT will each patient West worksheet with West blank face on it.  Each patient is to draw or write how they see themselves on the face of the worksheet.    Education:  Self-Esteem, Building control surveyorDischarge Planning.   Education Outcome: Acknowledges education/In group clarification offered/Needs additional education  Clinical Observations/Feedback: Pt stated to LRT it was hard for him to complete the activity because "I'm not West good person".  Pt stated he was deceptive and manipulative.  LRT stated to pt that in seeing him on the unit, he seemed like West nice person.  Pt was not receptive to the compliment.  Pt left early and did not return.   Brian West, Brian West   Brian West, Brian West 05/21/2016 1:28 PM

## 2016-05-21 NOTE — Progress Notes (Signed)
Patient was given discharge information (e.g. AVS and transition documentation), wife already picked up belongings prior. Patient received medication samples and scripts. Patient d/c'd at 12:15.

## 2016-05-21 NOTE — Progress Notes (Signed)
Patient appears sad and depressed. Patient is room and withdrawn. Facial expression is flat. He stated that he was doing okay and denied SI, HI, and AVH. Patient stated that he did not have depression or depression or anxious. There appears to be an inconsistency in mood.   Patient remains safe on unit with q 15 min checks. He was offered education, support, and encouragement. Medications were administered.   Patient is receptive and cooperative, will continue to monitor.

## 2016-05-22 LAB — T3: T3 TOTAL: 133 ng/dL (ref 71–180)

## 2016-07-29 DIAGNOSIS — Z139 Encounter for screening, unspecified: Secondary | ICD-10-CM

## 2016-07-30 NOTE — Congregational Nurse Program (Signed)
Congregational Nurse Program Note  Date of Encounter: 07/29/2016  Past Medical History: Past Medical History:  Diagnosis Date  . Anxiety   . Lumbar herniated disc     Encounter Details:     CNP Questionnaire - 07/29/16 1030      Patient Demographics   Is this a new or existing patient? New   Race Caucasian/White     Patient Assistance   Location of Patient Assistance [employer] [place]   Patient's financial/insurance status Low Income;Self-Pay   Uninsured Patient Yes   Interventions Counseled to make appt. with provider;Assisted patient in making appt.   Patient referred to apply for the following financial assistance Not Applicable   Food insecurities addressed Not Applicable   Transportation assistance No   Assistance securing medications No     Encounter Details   Primary purpose of visit Chronic Illness/Condition Visit;Navigating the Healthcare System;Safety;Education/Health Concerns   Was an Emergency Department visit averted? Not Applicable   Does patient have a medical provider? No   Patient referred to Area Agency;Clinic;Establish PCP;Doctor referral for a non-emergent behavioral health crisis   Was a mental health screening completed? (GAINS tool) Yes   Was a mental health referral made? Yes  [facility] BSW assisted client in calling his mental health provider to get an appointment for the following day   Does patient have dental issues? No   Does patient have vision issues? No   Does your patient have an abnormal blood pressure today? No   Since previous encounter, have you referred patient for abnormal blood pressure that resulted in a new diagnosis or medication change? No   Does your patient have an abnormal blood glucose today? No   Since previous encounter, have you referred patient for abnormal blood glucose that resulted in a new diagnosis or medication change? No   Was there a life-saving intervention made? No     Client previously screened by [facility] a year ago and was referred to the free Clinic however Client states [facility] and has not been seen by any other medical provider.  Client came in today with asking for assistance to be referred again to the free clinic. Client states that [facility] and [facility]. He reports he has been diagnosed with Schizophrenia "depression type" and borderline mood disorder. He states he has been on Haldol oral , Lamictal, Buspar and one other medication he cannot remember. He states his mental health psychiatrist had spoken to him about starting Haldol injections and that he would need a medical provider to give him those injections and [facility]  RN called to inquire with the Free Clinic if they would be able to administer Haldol injections for a client and they reported that they could not administer those.  RN discussed with client that we could refer him for general medical care , but they could not administer haldol injections, he states understanding and wished to proceed with referral for primary medical care. Referral made and appointment secured for 08/05/16 at 10:30am. [facility] BSW intern Amparo BristolBenita Dowdell and RN discussed with client if there could possibly be any other options regarding his mental health treatment and with the assistance of BSW intern , [facility] and client scheduled to meet on 07/30/16 with his therapist to discuss other possible  medication options. Further needs assessment and risk evaluation and safety plan developed with client per [facility] intern. RN and BSW intern Amparo Bristol will follow up with client later this week and after his PCP appointment.

## 2016-08-05 ENCOUNTER — Ambulatory Visit: Payer: Self-pay | Admitting: Physician Assistant

## 2016-08-11 ENCOUNTER — Encounter: Payer: Self-pay | Admitting: Physician Assistant

## 2016-08-14 ENCOUNTER — Encounter: Payer: Self-pay | Admitting: Physician Assistant

## 2016-08-14 ENCOUNTER — Ambulatory Visit: Payer: Self-pay | Admitting: Physician Assistant

## 2016-08-14 VITALS — BP 100/58 | HR 66 | Temp 98.1°F | Ht 70.0 in | Wt 166.0 lb

## 2016-08-14 DIAGNOSIS — F39 Unspecified mood [affective] disorder: Secondary | ICD-10-CM

## 2016-08-14 DIAGNOSIS — Z1159 Encounter for screening for other viral diseases: Secondary | ICD-10-CM

## 2016-08-14 DIAGNOSIS — Z862 Personal history of diseases of the blood and blood-forming organs and certain disorders involving the immune mechanism: Secondary | ICD-10-CM

## 2016-08-14 DIAGNOSIS — Z8639 Personal history of other endocrine, nutritional and metabolic disease: Secondary | ICD-10-CM

## 2016-08-14 DIAGNOSIS — F1911 Other psychoactive substance abuse, in remission: Secondary | ICD-10-CM

## 2016-08-14 DIAGNOSIS — R7989 Other specified abnormal findings of blood chemistry: Secondary | ICD-10-CM

## 2016-08-14 DIAGNOSIS — Z13220 Encounter for screening for lipoid disorders: Secondary | ICD-10-CM

## 2016-08-14 DIAGNOSIS — Z9189 Other specified personal risk factors, not elsewhere classified: Secondary | ICD-10-CM

## 2016-08-14 DIAGNOSIS — F17210 Nicotine dependence, cigarettes, uncomplicated: Secondary | ICD-10-CM

## 2016-08-14 NOTE — Progress Notes (Signed)
BP (!) 100/58 (BP Location: Left Arm, Patient Position: Sitting, Cuff Size: Normal)   Pulse 66   Temp 98.1 F (36.7 C) (Other (Comment))   Ht 5\' 10"  (1.778 m)   Wt 166 lb (75.3 kg)   SpO2 97%   BMI 23.82 kg/m    Subjective:    Patient ID: Brian West, male    DOB: 1971-05-02, 45 y.o.   MRN: [id number]  HPI: Brian West is a 45 y.o. male presenting on 08/14/2016 for New Patient (Initial Visit) (new/old pt)   HPI  Last seen here 01/31/15   [facility] MH issues. He says they diagnosed him with schizophrenia, biopolar d/o, anxiety and depression.  Pt says [facility] working to get him set up with [facility] to get his medications.  He says that they are also trying to help him get medicaid.  Pt states his wife was + for hep C  States drug use in august but was years prior to that last use. He Says he "was in bad place in august"  Relevant past medical, surgical, family and social history reviewed and updated as indicated. Interim medical history since our last visit reviewed. Allergies and medications reviewed and updated.   Current Outpatient Prescriptions:  Marland Kitchen.  UNABLE TO FIND, Med Name: haldol  Unknown dosage, Disp: , Rfl:  .  busPIRone (BUSPAR) 5 MG tablet, Take 1 tablet (5 mg total) by mouth 3 (three) times daily. (Patient not taking: Reported on 08/14/2016), Disp: 30 tablet, Rfl: 0 .  lamoTRIgine (LAMICTAL) 25 MG tablet, Take 1 tablet (25 mg total) by mouth daily. (Patient not taking: Reported on 08/14/2016), Disp: 30 tablet, Rfl: 0 .  QUEtiapine (SEROQUEL) 50 MG tablet, Take 3 tablets (150 mg total) by mouth at bedtime. (Patient not taking: Reported on 08/14/2016), Disp: 90 tablet, Rfl: 0   Review of Systems  Constitutional: Positive for fatigue. Negative for appetite change, chills, diaphoresis, fever and unexpected weight change.  HENT: Positive for dental problem. Negative for congestion, drooling, ear pain,  facial swelling, hearing loss, mouth sores, sneezing, sore throat, trouble swallowing and voice change.   Eyes: Negative for pain, discharge, redness, itching and visual disturbance.  Respiratory: Negative for cough, choking, shortness of breath and wheezing.   Cardiovascular: Negative for chest pain, palpitations and leg swelling.  Gastrointestinal: Negative for abdominal pain, blood in stool, constipation, diarrhea and vomiting.  Endocrine: Negative for cold intolerance, heat intolerance and polydipsia.  Genitourinary: Negative for decreased urine volume, dysuria and hematuria.  Musculoskeletal: Positive for back pain and gait problem. Negative for arthralgias.  Skin: Negative for rash.  Allergic/Immunologic: Negative for environmental allergies.  Neurological: Negative for seizures, syncope, light-headedness and headaches.  Hematological: Negative for adenopathy.  Psychiatric/Behavioral: Positive for agitation and dysphoric mood. Negative for suicidal ideas. The patient is nervous/anxious.     Per HPI unless specifically indicated above     Objective:    BP (!) 100/58 (BP Location: Left Arm, Patient Position: Sitting, Cuff Size: Normal)   Pulse 66   Temp 98.1 F (36.7 C) (Other (Comment))   Ht 5\' 10"  (1.778 m)   Wt 166 lb (75.3 kg)   SpO2 97%   BMI 23.82 kg/m   Wt Readings from Last 3 Encounters:  08/14/16 166 lb (75.3 kg)  05/12/16 160 lb (72.6 kg)  02/25/15 188 lb (85.3 kg)    Physical Exam  Constitutional: He is oriented to person, place,  and time. He appears well-developed and well-nourished.  HENT:  Head: Normocephalic and atraumatic.  Mouth/Throat: Oropharynx is clear and moist. No oropharyngeal exudate.  Eyes: Conjunctivae and EOM are normal. Pupils are equal, round, and reactive to light.  Neck: Neck supple. No thyromegaly present.  Cardiovascular: Normal rate and regular rhythm.   Pulmonary/Chest: Effort normal and breath sounds normal. He has no wheezes. He has  no rales.  Abdominal: Soft. Bowel sounds are normal. He exhibits no mass. There is no hepatosplenomegaly. There is no tenderness.  Musculoskeletal: He exhibits no edema.  Lymphadenopathy:    He has no cervical adenopathy.  Neurological: He is alert and oriented to person, place, and time.  Skin: Skin is warm and dry. No rash noted.  Psychiatric: He has a normal mood and affect. His behavior is normal. Thought content normal.  Vitals reviewed.       Assessment & Plan:   Encounter Diagnoses  Name Primary?  . Substance abuse in remission   . Abnormal TSH   . Encounter for hepatitis C virus screening test for high risk patient   . Cigarette nicotine dependence without complication   . Mood disorder (HCC)   . Screening for hyperlipidemia Yes  . History of hypokalemia   . History of anemia      -Labs done August 2017 at hospital:   Mild lipid elevation Mild anemia Mild low K Mildly abnormal tsh  -in response to pt's [facility] to receive and administer him IM haldol, Discussed with pt no facilities for receiving or keeping controlled substances so cannot help with haldol injections  -check labs  -[facility]  -follow up one month.  RTO sooner prn

## 2016-08-15 LAB — COMPREHENSIVE METABOLIC PANEL
ALBUMIN: 4.3 g/dL (ref 3.6–5.1)
ALK PHOS: 146 U/L — AB (ref 40–115)
ALT: 15 U/L (ref 9–46)
AST: 21 U/L (ref 10–40)
BILIRUBIN TOTAL: 0.4 mg/dL (ref 0.2–1.2)
BUN: 14 mg/dL (ref 7–25)
CALCIUM: 9.3 mg/dL (ref 8.6–10.3)
CO2: 22 mmol/L (ref 20–31)
Chloride: 104 mmol/L (ref 98–110)
Creat: 1.04 mg/dL (ref 0.60–1.35)
GLUCOSE: 98 mg/dL (ref 65–99)
Potassium: 4.5 mmol/L (ref 3.5–5.3)
Sodium: 138 mmol/L (ref 135–146)
Total Protein: 7.2 g/dL (ref 6.1–8.1)

## 2016-08-15 LAB — LIPID PANEL
CHOLESTEROL: 162 mg/dL (ref <200)
HDL: 31 mg/dL — ABNORMAL LOW (ref >40)
LDL Cholesterol: 92 mg/dL (ref <100)
Total CHOL/HDL Ratio: 5.2 Ratio — ABNORMAL HIGH (ref <5.0)
Triglycerides: 194 mg/dL — ABNORMAL HIGH (ref <150)
VLDL: 39 mg/dL — ABNORMAL HIGH (ref <30)

## 2016-08-15 LAB — HEMOGLOBIN: HEMOGLOBIN: 14.3 g/dL (ref 13.2–17.1)

## 2016-08-15 LAB — TSH: TSH: 1.74 mIU/L (ref 0.40–4.50)

## 2016-08-16 LAB — HEPATITIS PANEL, ACUTE
HCV AB: NEGATIVE
HEP A IGM: NONREACTIVE
HEP B C IGM: NONREACTIVE
HEP B S AG: NEGATIVE

## 2016-09-15 ENCOUNTER — Encounter: Payer: Self-pay | Admitting: Physician Assistant

## 2016-09-15 ENCOUNTER — Ambulatory Visit: Payer: Self-pay | Admitting: Physician Assistant

## 2016-09-15 VITALS — BP 100/54 | HR 82 | Temp 98.8°F | Ht 70.0 in | Wt 168.0 lb

## 2016-09-15 DIAGNOSIS — F17210 Nicotine dependence, cigarettes, uncomplicated: Secondary | ICD-10-CM

## 2016-09-15 DIAGNOSIS — E785 Hyperlipidemia, unspecified: Secondary | ICD-10-CM

## 2016-09-15 DIAGNOSIS — F39 Unspecified mood [affective] disorder: Secondary | ICD-10-CM

## 2016-09-15 NOTE — Progress Notes (Signed)
BP (!) 100/54 (BP Location: Left Arm, Patient Position: Sitting, Cuff Size: Normal)   Pulse 82   Temp 98.8 F (37.1 C) (Other (Comment))   Ht 5' 10" (1.778 m)   Wt 168 lb (76.2 kg)   SpO2 97%   BMI 24.11 kg/m    Subjective:    Patient ID: Brian West, male    DOB: 01-10-1971, 45 y.o.   MRN: [id number]  HPI: Brian West is a 45 y.o. male presenting on 09/15/2016 for Follow-up   HPI   Pt is still going to [facility].  He has appt there today at 11:45  Pt concerned about hep C since his wife has it and they had unprotected sex.    Relevant past medical, surgical, family and social history reviewed and updated as indicated. Interim medical history since our last visit reviewed. Allergies and medications reviewed and updated.   Current Outpatient Prescriptions:  .  busPIRone (BUSPAR) 5 MG tablet, Take 1 tablet (5 mg total) by mouth 3 (three) times daily., Disp: 30 tablet, Rfl: 0 .  haloperidol (HALDOL) 0.5 MG tablet, Take 0.5 mg by mouth 2 (two) times daily., Disp: , Rfl:  .  lamoTRIgine (LAMICTAL) 25 MG tablet, Take 1 tablet (25 mg total) by mouth daily. (Patient not taking: Reported on 09/15/2016), Disp: 30 tablet, Rfl: 0 .  QUEtiapine (SEROQUEL) 50 MG tablet, Take 3 tablets (150 mg total) by mouth at bedtime. (Patient not taking: Reported on 09/15/2016), Disp: 90 tablet, Rfl: 0   Review of Systems  Constitutional: Positive for fatigue. Negative for appetite change, chills, diaphoresis, fever and unexpected weight change.  HENT: Positive for dental problem. Negative for congestion, drooling, ear pain, facial swelling, hearing loss, mouth sores, sneezing, sore throat, trouble swallowing and voice change.   Eyes: Negative for pain, discharge, redness, itching and visual disturbance.  Respiratory: Negative for cough, choking, shortness of breath and wheezing.   Cardiovascular: Negative for chest pain, palpitations and leg swelling.  Gastrointestinal:  Negative for abdominal pain, blood in stool, constipation, diarrhea and vomiting.  Endocrine: Negative for cold intolerance, heat intolerance and polydipsia.  Genitourinary: Negative for decreased urine volume, dysuria and hematuria.  Musculoskeletal: Positive for back pain. Negative for arthralgias and gait problem.  Skin: Negative for rash.  Allergic/Immunologic: Negative for environmental allergies.  Neurological: Negative for seizures, syncope, light-headedness and headaches.  Hematological: Negative for adenopathy.  Psychiatric/Behavioral: Positive for agitation and dysphoric mood. Negative for suicidal ideas. The patient is nervous/anxious.     Per HPI unless specifically indicated above     Objective:    BP (!) 100/54 (BP Location: Left Arm, Patient Position: Sitting, Cuff Size: Normal)   Pulse 82   Temp 98.8 F (37.1 C) (Other (Comment))   Ht 5' 10" (1.778 m)   Wt 168 lb (76.2 kg)   SpO2 97%   BMI 24.11 kg/m   Wt Readings from Last 3 Encounters:  09/15/16 168 lb (76.2 kg)  08/14/16 166 lb (75.3 kg)  05/12/16 160 lb (72.6 kg)    Physical Exam  Constitutional: He is oriented to person, place, and time. He appears well-developed and well-nourished.  HENT:  Head: Normocephalic and atraumatic.  Neck: Neck supple.  Cardiovascular: Normal rate and regular rhythm.   Pulmonary/Chest: Effort normal and breath sounds normal. He has no wheezes.  Abdominal: Soft. Bowel sounds are normal. There is no hepatosplenomegaly. There is no tenderness.  Musculoskeletal: He exhibits no edema.  Lymphadenopathy:    He has  no cervical adenopathy.  Neurological: He is alert and oriented to person, place, and time.  Skin: Skin is warm and dry.  Psychiatric: He has a normal mood and affect. His behavior is normal.  Vitals reviewed.   Results for orders placed or performed in visit on 08/14/16  Hepatitis panel, acute  Result Value Ref Range   Hepatitis B Surface Ag NEGATIVE NEGATIVE   HCV  Ab NEGATIVE NEGATIVE   Hep B C IgM NON REACTIVE NON REACTIVE   Hep A IgM NON REACTIVE NON REACTIVE  Lipid Profile  Result Value Ref Range   Cholesterol 162 <200 mg/dL   Triglycerides 194 (H) <150 mg/dL   HDL 31 (L) >40 mg/dL   Total CHOL/HDL Ratio 5.2 (H) <5.0 Ratio   VLDL 39 (H) <30 mg/dL   LDL Cholesterol 92 <100 mg/dL  Comprehensive Metabolic Panel (CMET)  Result Value Ref Range   Sodium 138 135 - 146 mmol/L   Potassium 4.5 3.5 - 5.3 mmol/L   Chloride 104 98 - 110 mmol/L   CO2 22 20 - 31 mmol/L   Glucose, Bld 98 65 - 99 mg/dL   BUN 14 7 - 25 mg/dL   Creat 1.04 0.60 - 1.35 mg/dL   Total Bilirubin 0.4 0.2 - 1.2 mg/dL   Alkaline Phosphatase 146 (H) 40 - 115 U/L   AST 21 10 - 40 U/L   ALT 15 9 - 46 U/L   Total Protein 7.2 6.1 - 8.1 g/dL   Albumin 4.3 3.6 - 5.1 g/dL   Calcium 9.3 8.6 - 10.3 mg/dL  TSH  Result Value Ref Range   TSH 1.74 0.40 - 4.50 mIU/L  Hemoglobin  Result Value Ref Range   Hemoglobin 14.3 13.2 - 17.1 g/dL      Assessment & Plan:   Encounter Diagnoses  Name Primary?  . Cigarette nicotine dependence without complication Yes  . Mood disorder (HCC)   . Hyperlipidemia, unspecified hyperlipidemia type     -reviewed labs with pt -Alk phos up some from august- monitor -counseled on Lowfat diet and gave handout -gave [place] inforation on hep c- repeat hep c test 6 months.  Counseled pt about prevention of hep c spread including condoms, not sharing razors, toothbrushes, etc -follow up 6 months.  RTO sooner prn    

## 2016-09-15 NOTE — Patient Instructions (Addendum)
Fat and Cholesterol Restricted Diet Introduction Getting too much fat and cholesterol in your diet may cause health problems. Following this diet helps keep your fat and cholesterol at normal levels. This can keep you from getting sick. What types of fat should I choose?  Choose monosaturated and polyunsaturated fats. These are found in foods such as olive oil, canola oil, flaxseeds, walnuts, almonds, and seeds.  Eat more omega-3 fats. Good choices include salmon, mackerel, sardines, tuna, flaxseed oil, and ground flaxseeds.  Limit saturated fats. These are in animal products such as meats, butter, and cream. They can also be in plant products such as palm oil, palm kernel oil, and coconut oil.  Avoid foods with partially hydrogenated oils in them. These contain trans fats. Examples of foods that have trans fats are stick margarine, some tub margarines, cookies, crackers, and other baked goods. What general guidelines do I need to follow?  Check food labels. Look for the words "trans fat" and "saturated fat."  When preparing a meal:  Fill half of your plate with vegetables and green salads.  Fill one fourth of your plate with whole grains. Look for the word "whole" as the first word in the ingredient list.  Fill one fourth of your plate with lean protein foods.  Eat more foods that have fiber, like apples, carrots, beans, peas, and barley.  Eat more home-cooked foods. Eat less at restaurants and buffets.  Limit or avoid alcohol.  Limit foods high in starch and sugar.  Limit fried foods.  Cook foods without frying them. Baking, boiling, grilling, and broiling are all great options.  Lose weight if you are overweight. Losing even a small amount of weight can help your overall health. It can also help prevent diseases such as diabetes and heart disease. What foods can I eat? Grains  Whole grains, such as whole wheat or whole grain breads, crackers, cereals, and pasta. Unsweetened  oatmeal, bulgur, barley, quinoa, or brown rice. Corn or whole wheat flour tortillas. Vegetables  Fresh or frozen vegetables (raw, steamed, roasted, or grilled). Green salads. Fruits  All fresh, canned (in natural juice), or frozen fruits. Meat and Other Protein Products  Ground beef (85% or leaner), grass-fed beef, or beef trimmed of fat. Skinless chicken or turkey. Ground chicken or turkey. Pork trimmed of fat. All fish and seafood. Eggs. Dried beans, peas, or lentils. Unsalted nuts or seeds. Unsalted canned or dry beans. Dairy  Low-fat dairy products, such as skim or 1% milk, 2% or reduced-fat cheeses, low-fat ricotta or cottage cheese, or plain low-fat yogurt. Fats and Oils  Tub margarines without trans fats. Light or reduced-fat mayonnaise and salad dressings. Avocado. Olive, canola, sesame, or safflower oils. Natural peanut or almond butter (choose ones without added sugar and oil). The items listed above may not be a complete list of recommended foods or beverages. Contact your dietitian for more options.  What foods are not recommended? Grains  White bread. White pasta. White rice. Cornbread. Bagels, pastries, and croissants. Crackers that contain trans fat. Vegetables  White potatoes. Corn. Creamed or fried vegetables. Vegetables in a cheese sauce. Fruits  Dried fruits. Canned fruit in light or heavy syrup. Fruit juice. Meat and Other Protein Products  Fatty cuts of meat. Ribs, chicken wings, bacon, sausage, bologna, salami, chitterlings, fatback, hot dogs, bratwurst, and packaged luncheon meats. Liver and organ meats. Dairy  Whole or 2% milk, cream, half-and-half, and cream cheese. Whole milk cheeses. Whole-fat or sweetened yogurt. Full-fat cheeses. Nondairy creamers and   whipped toppings. Processed cheese, cheese spreads, or cheese curds. Sweets and Desserts  Corn syrup, sugars, honey, and molasses. Candy. Jam and jelly. Syrup. Sweetened cereals. Cookies, pies, cakes, donuts,  muffins, and ice cream. Fats and Oils  Butter, stick margarine, lard, shortening, ghee, or bacon fat. Coconut, palm kernel, or palm oils. Beverages  Alcohol. Sweetened drinks (such as sodas, lemonade, and fruit drinks or punches). The items listed above may not be a complete list of foods and beverages to avoid. Contact your dietitian for more information.  This information is not intended to replace advice given to you by your health care provider. Make sure you discuss any questions you have with your health care provider. Document Released: 03/23/2012 Document Revised: 05/29/2016 Document Reviewed: 12/22/2013  2017 Elsevier      Hepatitis C Hepatitis C is a viral infection of the liver. It can lead to scarring of the liver (cirrhosis), liver failure, or liver cancer. Hepatitis C may go undetected for months or years because people with the infection may not have symptoms, or they may have only mild symptoms. What are the causes? Hepatitis C is caused by the hepatitis C virus (HCV). The virus can be passed from one person to another through:  Blood.  Contaminated needles, such as those used for tattooing, body piercing, acupuncture, or injecting drugs.  Having unprotected sex with an infected person.  Childbirth.  Blood transfusions or organ transplants done in the [place] before 1992. What increases the risk? Risk factors for hepatitis C include:  Having unprotected sex with an infected person.  Using illegal drugs. What are the signs or symptoms? Symptoms of hepatitis C may include:  Fatigue.  Loss of appetite.  Nausea.  Vomiting.  Abdominal pain.  Dark yellow urine.  Yellowish skin and eyes (jaundice).  Itching of the skin.  Clay-colored bowel movements.  Joint pain. Symptoms are not always present. How is this diagnosed? Hepatitis C is diagnosed with blood tests. Other types of tests may also be done to check how your liver is functioning. How  is this treated? Your health care provider may perform noninvasive tests or a liver biopsy to help determine the best course of treatment. Treatment for hepatitis C may include one or more medicines. Your health care provider may check you for a recurring infection or other liver conditions every 6-12 months after treatment. Follow these instructions at home:  Rest as needed.  Take all medicines as directed by your health care provider.  Do not take any medicine unless approved by your health care provider. This includes over-the-counter medicine and birth control pills.  Do not drink alcohol.  Do not have sex until approved by your health care provider.  Do not share toothbrushes, nail clippers, razors, or needles. How is this prevented? There is no vaccine for hepatitis C. The only way to prevent the disease is to reduce the risk of exposure to the virus. This may be done by:  Practicing safe sex and using condoms.  Avoiding illegal drugs. Contact a health care provider if:  You have a fever.  You develop abdominal pain.  You develop dark urine.  You have clay-colored bowel movements.  You develop joint pains. Get help right away if:  You have increasing fatigue or weakness.  You lose your appetite.  You feel nauseous or vomit.  You develop jaundice or your jaundice gets worse.  You bruise or bleed easily. This information is not intended to replace advice given to you  by your health care provider. Make sure you discuss any questions you have with your health care provider. Document Released: 09/19/2000 Document Revised: 02/28/2016 Document Reviewed: 01/04/2014 Elsevier Interactive Patient Education  2017 [employer]

## 2016-10-25 ENCOUNTER — Emergency Department (HOSPITAL_COMMUNITY)
Admission: EM | Admit: 2016-10-25 | Discharge: 2016-10-25 | Disposition: A | Discharge status: Home / Self Care | Payer: Medicaid Other | Attending: Emergency Medicine | Admitting: Emergency Medicine

## 2016-10-25 ENCOUNTER — Encounter (HOSPITAL_COMMUNITY): Payer: Self-pay | Admitting: *Deleted

## 2016-10-25 DIAGNOSIS — T25111A Burn of first degree of right ankle, initial encounter: Secondary | ICD-10-CM | POA: Diagnosis not present

## 2016-10-25 DIAGNOSIS — X76XXXA Intentional self-harm by smoke, fire and flames, initial encounter: Secondary | ICD-10-CM | POA: Insufficient documentation

## 2016-10-25 DIAGNOSIS — Y939 Activity, unspecified: Secondary | ICD-10-CM | POA: Insufficient documentation

## 2016-10-25 DIAGNOSIS — F17210 Nicotine dependence, cigarettes, uncomplicated: Secondary | ICD-10-CM | POA: Diagnosis not present

## 2016-10-25 DIAGNOSIS — T24032A Burn of unspecified degree of left lower leg, initial encounter: Secondary | ICD-10-CM | POA: Diagnosis present

## 2016-10-25 DIAGNOSIS — T300 Burn of unspecified body region, unspecified degree: Secondary | ICD-10-CM

## 2016-10-25 DIAGNOSIS — Y92149 Unspecified place in prison as the place of occurrence of the external cause: Secondary | ICD-10-CM | POA: Diagnosis not present

## 2016-10-25 DIAGNOSIS — Y999 Unspecified external cause status: Secondary | ICD-10-CM | POA: Insufficient documentation

## 2016-10-25 DIAGNOSIS — T24232A Burn of second degree of left lower leg, initial encounter: Secondary | ICD-10-CM | POA: Insufficient documentation

## 2016-10-25 DIAGNOSIS — F32A Depression, unspecified: Secondary | ICD-10-CM

## 2016-10-25 DIAGNOSIS — F329 Major depressive disorder, single episode, unspecified: Secondary | ICD-10-CM | POA: Insufficient documentation

## 2016-10-25 DIAGNOSIS — Z79899 Other long term (current) drug therapy: Secondary | ICD-10-CM | POA: Insufficient documentation

## 2016-10-25 HISTORY — DX: Schizophrenia, unspecified: F20.9

## 2016-10-25 HISTORY — DX: Bipolar disorder, unspecified: F31.9

## 2016-10-25 MED ORDER — SILVER SULFADIAZINE 1 % EX CREA: 1.0000 "application " | TOPICAL_CREAM | Freq: Every day | CUTANEOUS | 0 refills | Status: AC

## 2016-10-25 MED ORDER — SILVER SULFADIAZINE 1 % EX CREA
TOPICAL_CREAM | Freq: Once | CUTANEOUS | Status: AC
  Administered 2016-10-25: 23:00:00 via TOPICAL
  Filled 2016-10-25: qty 50

## 2016-10-25 NOTE — Discharge Instructions (Signed)
Patient can bathe. Apply Silvadene ointment once daily with sterile dressing. Do not rupture the skin bubbles. They will break on their own. Recheck in 2 days
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
## 2017-01-08 IMAGING — MR MR LUMBAR SPINE W/O CM
4 of 6 series · 14 of 48 positions shown · non-contrast
Comparison: None.

CLINICAL DATA: Low back pain.

EXAM:
MRI LUMBAR SPINE WITHOUT CONTRAST
TECHNIQUE: Multiplanar, multisequence MR imaging of the lumbar spine was
performed. No intravenous contrast was administered.

[Series 3: T2 · sagittal · 4.0mm · 0.78mm/px · 4 of 15 slices shown (1 of 2)]
[im 1/15]
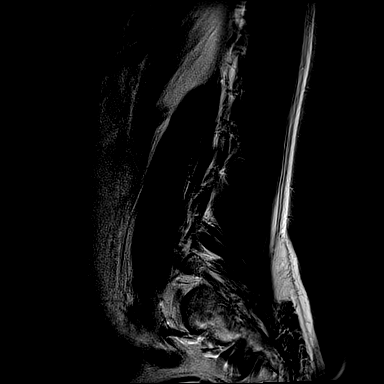
[im 5/15]
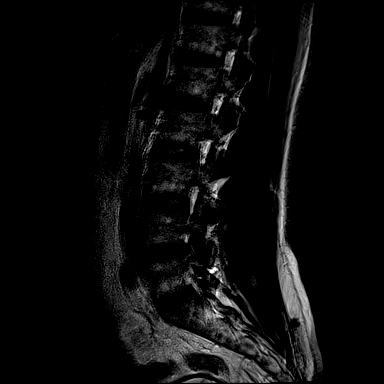
[im 10/15]
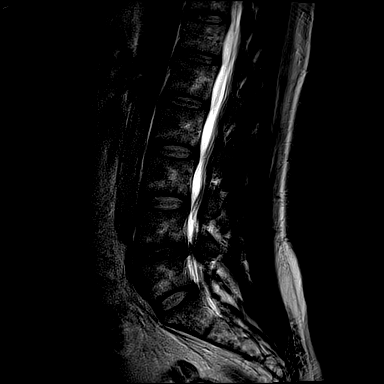
[im 15/15]
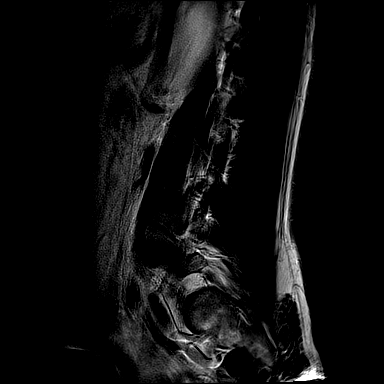

[Series 4: T1 · sagittal · 4.0mm · 0.41mm/px · 3 of 15 slices shown (1 of 2)]
[im 1/15]
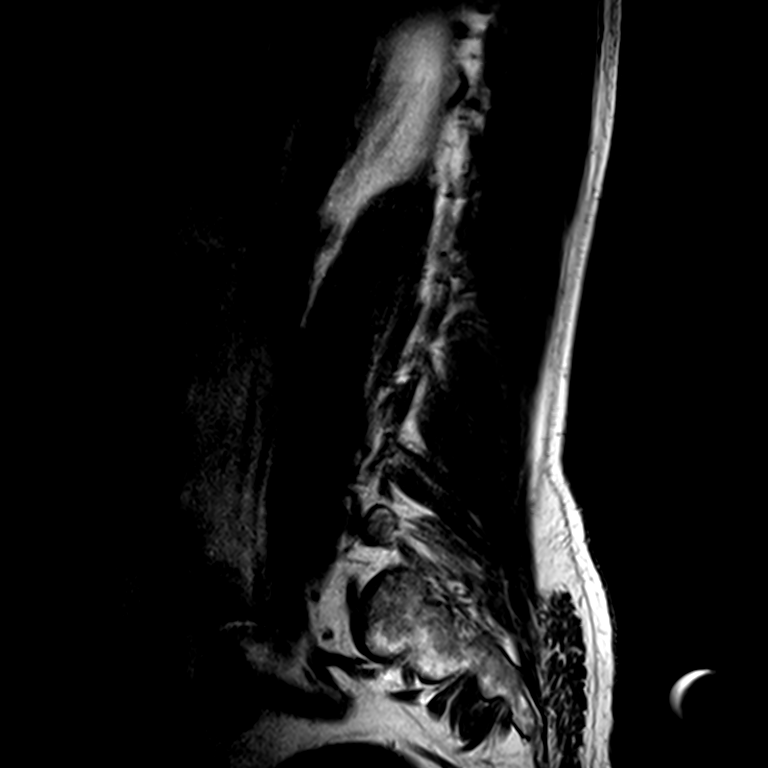
[im 10/15]
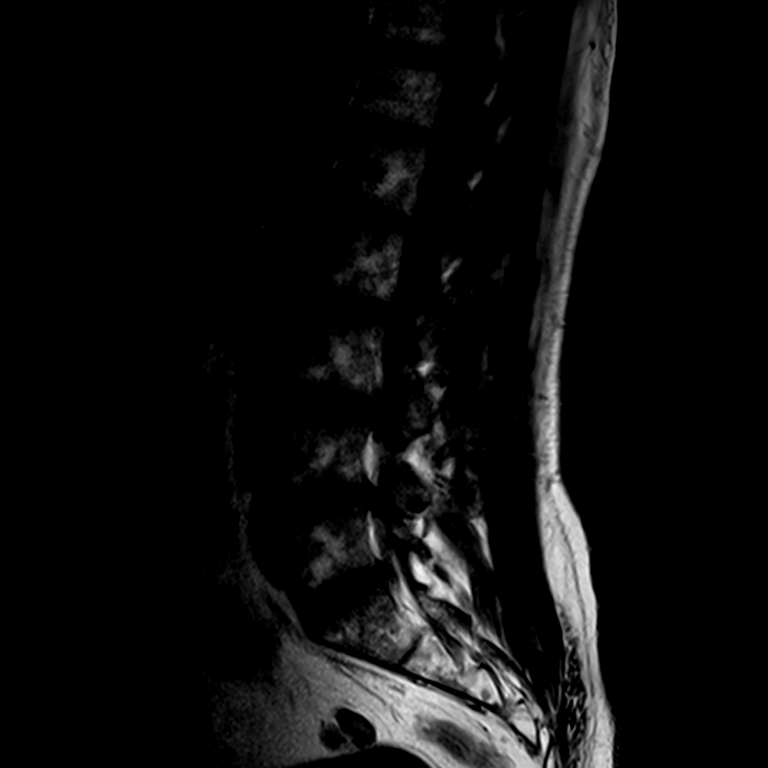
[im 15/15]
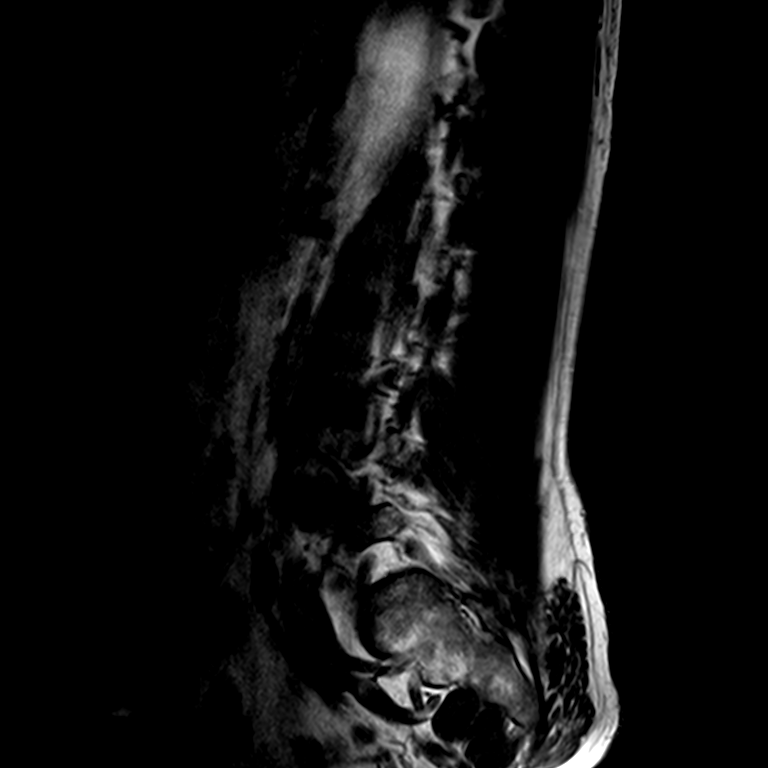

[Series 6: T2 · axial · 4.0mm · 0.20mm/px · z∈[-115,+70]mm · 4 of 46 slices shown (2 of 2)]
[im 1/46]
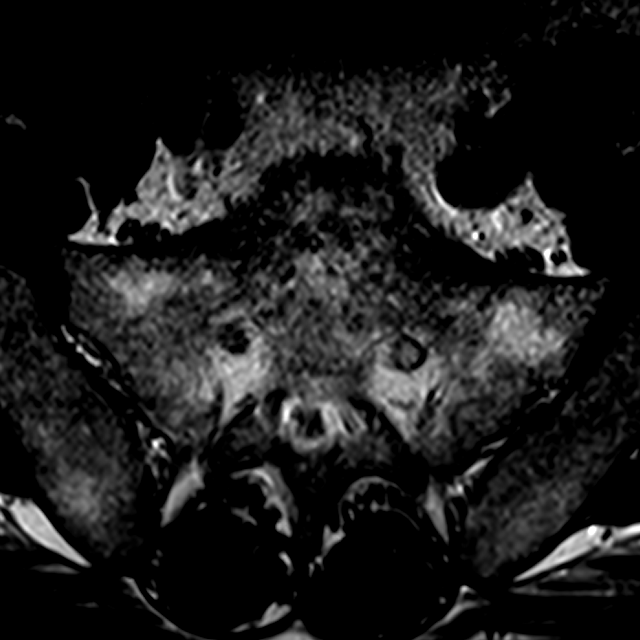
[im 7/46]
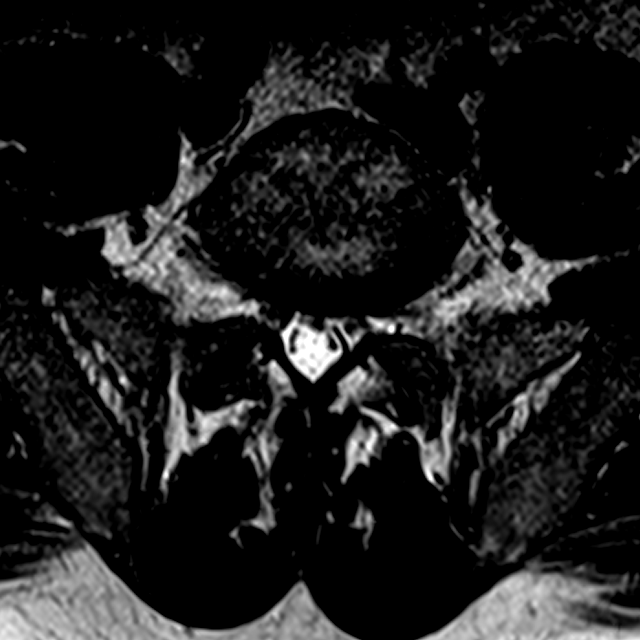
[im 23/46]
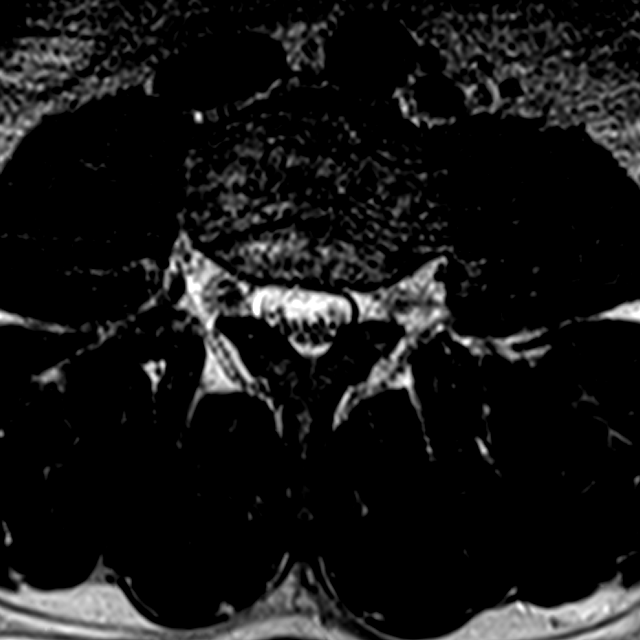
[im 39/46]
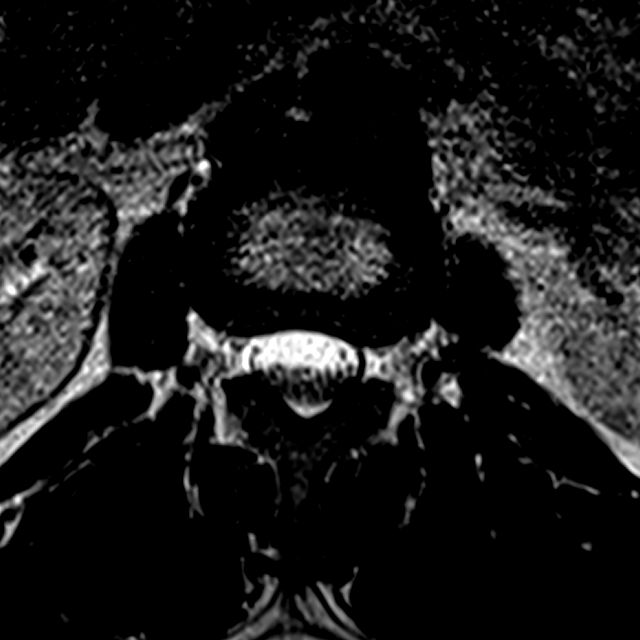

[Series 7: T1 · axial · 4.0mm · 0.20mm/px · z∈[-89,+71]mm · 3 of 46 slices shown (2 of 2)]
[im 7/46]
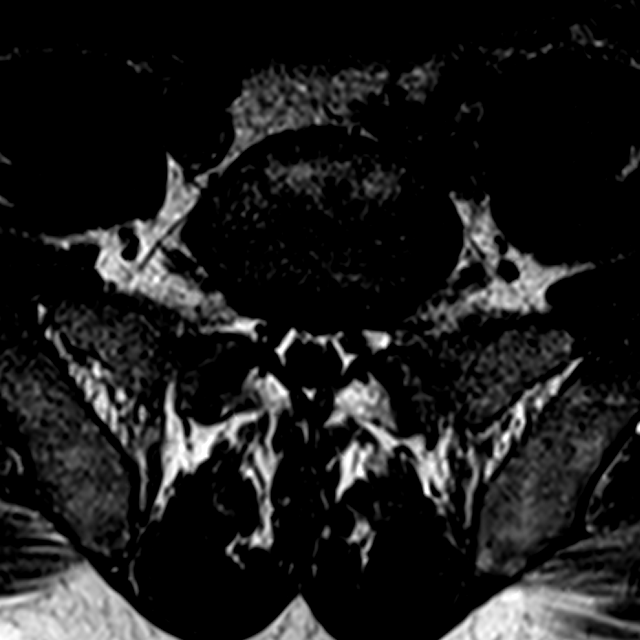
[im 23/46]
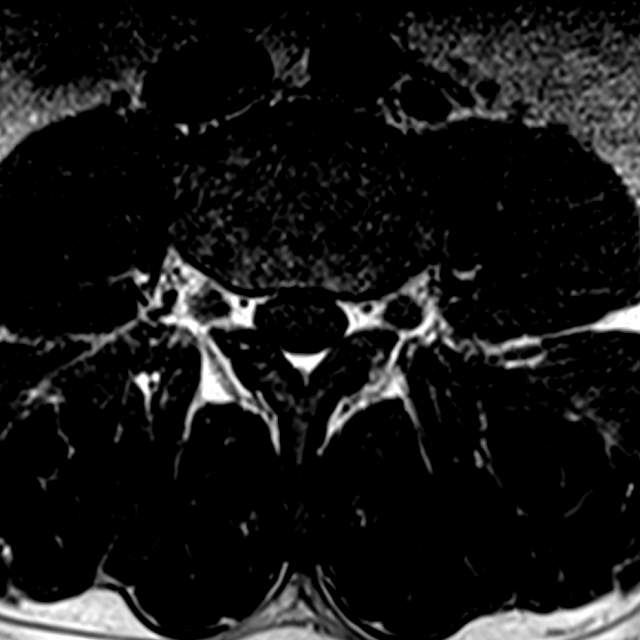
[im 39/46]
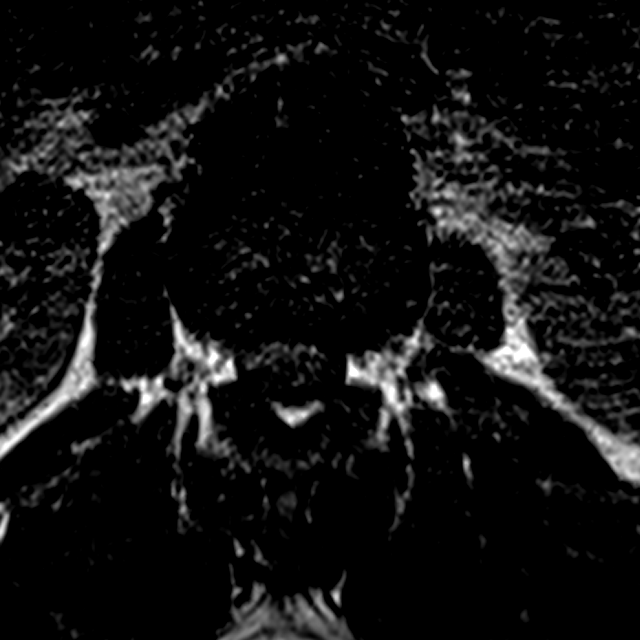

[14 of 48 positions shown; findings below may reference images not displayed]

FINDINGS: The vertebral bodies of the lumbar spine are normal in size. The
vertebral bodies of the lumbar spine are normal in alignment. There
is normal bone marrow signal demonstrated throughout the vertebra.
The intervertebral disc spaces are well-maintained. Mild disc
desiccation at L4-5.

The spinal cord is normal in signal and contour. The cord terminates
normally at L1 . The nerve roots of the cauda equina and the filum
terminale are normal.

The visualized portions of the SI joints are unremarkable.

The imaged intra-abdominal contents are unremarkable.

T12-L1: No significant disc bulge. No evidence of neural foraminal
stenosis. No central canal stenosis.

L1-L2: No significant disc bulge. No evidence of neural foraminal
stenosis. No central canal stenosis.

L2-L3: Mild broad-based disc bulge. No evidence of neural foraminal
stenosis. No central canal stenosis.

L3-L4: No significant disc bulge. No evidence of neural foraminal
stenosis. No central canal stenosis.

L4-L5: Mild broad-based disc bulge and mild bilateral facet
arthropathy with bilateral lateral recess stenosis. No evidence of
neural foraminal stenosis. No central canal stenosis.

L5-S1: No significant disc bulge. No evidence of neural foraminal
stenosis. No central canal stenosis.
IMPRESSION: 1. At L4-5 there is a mild broad-based disc bulge and mild bilateral
facet arthropathy with mild bilateral recess stenosis.
2. At L2-3 there is a mild broad-based disc bulge.

## 2017-03-16 ENCOUNTER — Ambulatory Visit: Payer: Self-pay | Admitting: Physician Assistant

## 2017-03-25 ENCOUNTER — Encounter: Payer: Self-pay | Admitting: Physician Assistant
